# Patient Record
Sex: Male | Born: 1955 | Race: White | Hispanic: No | Marital: Married | Smoking: Former smoker
Health system: Southern US, Community
[De-identification: ages and names within clinical notes are randomized; demographics above are authoritative.]

## PROBLEM LIST (undated history)

## (undated) DIAGNOSIS — I10 Essential (primary) hypertension: Secondary | ICD-10-CM

## (undated) DIAGNOSIS — E119 Type 2 diabetes mellitus without complications: Secondary | ICD-10-CM

## (undated) DIAGNOSIS — E7800 Pure hypercholesterolemia, unspecified: Secondary | ICD-10-CM

## (undated) HISTORY — PX: CHOLECYSTECTOMY: SHX55

---

## 2018-07-02 ENCOUNTER — Encounter (HOSPITAL_BASED_OUTPATIENT_CLINIC_OR_DEPARTMENT_OTHER): Payer: Self-pay | Admitting: *Deleted

## 2018-07-02 ENCOUNTER — Other Ambulatory Visit: Payer: Self-pay

## 2018-07-02 ENCOUNTER — Emergency Department (HOSPITAL_BASED_OUTPATIENT_CLINIC_OR_DEPARTMENT_OTHER): Payer: BLUE CROSS/BLUE SHIELD

## 2018-07-02 ENCOUNTER — Observation Stay (HOSPITAL_BASED_OUTPATIENT_CLINIC_OR_DEPARTMENT_OTHER)
Admission: EM | Admit: 2018-07-02 | Discharge: 2018-07-04 | Disposition: A | Discharge status: Home / Self Care | Payer: BLUE CROSS/BLUE SHIELD | Attending: Internal Medicine | Admitting: Internal Medicine

## 2018-07-02 DIAGNOSIS — Z87891 Personal history of nicotine dependence: Secondary | ICD-10-CM | POA: Insufficient documentation

## 2018-07-02 DIAGNOSIS — E7800 Pure hypercholesterolemia, unspecified: Secondary | ICD-10-CM | POA: Diagnosis not present

## 2018-07-02 DIAGNOSIS — I10 Essential (primary) hypertension: Secondary | ICD-10-CM | POA: Diagnosis present

## 2018-07-02 DIAGNOSIS — I4510 Unspecified right bundle-branch block: Secondary | ICD-10-CM | POA: Insufficient documentation

## 2018-07-02 DIAGNOSIS — E119 Type 2 diabetes mellitus without complications: Secondary | ICD-10-CM

## 2018-07-02 DIAGNOSIS — Z6835 Body mass index (BMI) 35.0-35.9, adult: Secondary | ICD-10-CM | POA: Insufficient documentation

## 2018-07-02 DIAGNOSIS — G4730 Sleep apnea, unspecified: Secondary | ICD-10-CM

## 2018-07-02 DIAGNOSIS — E669 Obesity, unspecified: Secondary | ICD-10-CM | POA: Insufficient documentation

## 2018-07-02 DIAGNOSIS — G4733 Obstructive sleep apnea (adult) (pediatric): Secondary | ICD-10-CM | POA: Diagnosis not present

## 2018-07-02 DIAGNOSIS — Z8249 Family history of ischemic heart disease and other diseases of the circulatory system: Secondary | ICD-10-CM | POA: Insufficient documentation

## 2018-07-02 DIAGNOSIS — Z833 Family history of diabetes mellitus: Secondary | ICD-10-CM | POA: Insufficient documentation

## 2018-07-02 DIAGNOSIS — R0789 Other chest pain: Principal | ICD-10-CM | POA: Insufficient documentation

## 2018-07-02 DIAGNOSIS — R079 Chest pain, unspecified: Secondary | ICD-10-CM | POA: Diagnosis not present

## 2018-07-02 DIAGNOSIS — R51 Headache: Secondary | ICD-10-CM | POA: Diagnosis not present

## 2018-07-02 DIAGNOSIS — R072 Precordial pain: Secondary | ICD-10-CM

## 2018-07-02 DIAGNOSIS — E785 Hyperlipidemia, unspecified: Secondary | ICD-10-CM | POA: Diagnosis not present

## 2018-07-02 HISTORY — DX: Essential (primary) hypertension: I10

## 2018-07-02 HISTORY — DX: Pure hypercholesterolemia, unspecified: E78.00

## 2018-07-02 HISTORY — DX: Type 2 diabetes mellitus without complications: E11.9

## 2018-07-02 LAB — BASIC METABOLIC PANEL
Anion gap: 7 (ref 5–15)
BUN: 18 mg/dL (ref 8–23)
CO2: 26 mmol/L (ref 22–32)
Calcium: 9.6 mg/dL (ref 8.9–10.3)
Chloride: 106 mmol/L (ref 98–111)
Creatinine, Ser: 1 mg/dL (ref 0.61–1.24)
GFR calc Af Amer: >60 mL/min (ref >60)
GFR calc non Af Amer: >60 mL/min (ref >60)
Glucose, Bld: 86 mg/dL (ref 70–99)
Potassium: 4.3 mmol/L (ref 3.5–5.1)
Sodium: 139 mmol/L (ref 135–145)

## 2018-07-02 LAB — RAPID URINE DRUG SCREEN, HOSP PERFORMED
Amphetamines: NOT DETECTED
Barbiturates: NOT DETECTED
Benzodiazepines: NOT DETECTED
Cocaine: NOT DETECTED
Opiates: NOT DETECTED
Tetrahydrocannabinol: NOT DETECTED

## 2018-07-02 LAB — CBC
HCT: 44.8 % (ref 39.0–52.0)
Hemoglobin: 14.2 g/dL (ref 13.0–17.0)
MCH: 27.4 pg (ref 26.0–34.0)
MCHC: 31.7 g/dL (ref 30.0–36.0)
MCV: 86.5 fL (ref 80.0–100.0)
Platelets: 222 10*3/uL (ref 150–400)
RBC: 5.18 MIL/uL (ref 4.22–5.81)
RDW: 13 % (ref 11.5–15.5)
WBC: 5.7 10*3/uL (ref 4.0–10.5)
nRBC: 0 % (ref 0.0–0.2)

## 2018-07-02 LAB — TROPONIN I
Troponin I: <0.03 ng/mL (ref <0.03)
Troponin I: <0.03 ng/mL (ref <0.03)
Troponin I: <0.03 ng/mL (ref <0.03)

## 2018-07-02 LAB — GLUCOSE, CAPILLARY: Glucose-Capillary: 106 mg/dL — ABNORMAL HIGH (ref 70–99)

## 2018-07-02 MED ORDER — ACETAMINOPHEN 325 MG PO TABS
650.0000 mg | ORAL_TABLET | ORAL | Status: DC | PRN
  Administered 2018-07-02 – 2018-07-03 (×2): 650 mg via ORAL
  Filled 2018-07-02 (×2): qty 2

## 2018-07-02 MED ORDER — ASPIRIN 81 MG PO CHEW
324.0000 mg | CHEWABLE_TABLET | Freq: Once | ORAL | Status: AC
  Administered 2018-07-02: 324 mg via ORAL
  Filled 2018-07-02: qty 4

## 2018-07-02 MED ORDER — ALPRAZOLAM 0.25 MG PO TABS: 0.2500 mg | ORAL_TABLET | Freq: Two times a day (BID) | ORAL | Status: DC | PRN

## 2018-07-02 MED ORDER — MORPHINE SULFATE (PF) 2 MG/ML IV SOLN: 2.0000 mg | INTRAVENOUS | Status: DC | PRN

## 2018-07-02 MED ORDER — ONDANSETRON HCL 4 MG/2ML IJ SOLN: 4.0000 mg | Freq: Four times a day (QID) | INTRAMUSCULAR | Status: DC | PRN

## 2018-07-02 MED ORDER — INSULIN ASPART 100 UNIT/ML ~~LOC~~ SOLN: 0.0000 [IU] | Freq: Every day | SUBCUTANEOUS | Status: DC

## 2018-07-02 MED ORDER — ZOLPIDEM TARTRATE 5 MG PO TABS: 5.0000 mg | ORAL_TABLET | Freq: Every evening | ORAL | Status: DC | PRN

## 2018-07-02 MED ORDER — ENOXAPARIN SODIUM 40 MG/0.4ML ~~LOC~~ SOLN
40.0000 mg | SUBCUTANEOUS | Status: DC
  Administered 2018-07-02: 40 mg via SUBCUTANEOUS
  Filled 2018-07-02: qty 0.4

## 2018-07-02 MED ORDER — INSULIN ASPART 100 UNIT/ML ~~LOC~~ SOLN: 0.0000 [IU] | Freq: Three times a day (TID) | SUBCUTANEOUS | Status: DC

## 2018-07-02 MED ORDER — LISINOPRIL 10 MG PO TABS
20.0000 mg | ORAL_TABLET | Freq: Every day | ORAL | Status: DC
  Administered 2018-07-02 – 2018-07-03 (×2): 20 mg via ORAL
  Filled 2018-07-02 (×2): qty 2

## 2018-07-02 MED ORDER — ASPIRIN 81 MG PO CHEW
324.0000 mg | CHEWABLE_TABLET | Freq: Every day | ORAL | Status: DC
  Administered 2018-07-03: 324 mg via ORAL
  Filled 2018-07-02: qty 4

## 2018-07-02 MED ORDER — HYDRALAZINE HCL 20 MG/ML IJ SOLN: 5.0000 mg | INTRAMUSCULAR | Status: DC | PRN

## 2018-07-02 MED ORDER — SODIUM CHLORIDE 0.9 % IV SOLN
INTRAVENOUS | Status: DC
  Administered 2018-07-02 – 2018-07-03 (×2): via INTRAVENOUS

## 2018-07-02 MED ORDER — NITROGLYCERIN 0.4 MG SL SUBL
0.4000 mg | SUBLINGUAL_TABLET | SUBLINGUAL | Status: DC | PRN
  Administered 2018-07-02: 0.4 mg via SUBLINGUAL
  Filled 2018-07-02: qty 1

## 2018-07-02 MED ORDER — ATORVASTATIN CALCIUM 40 MG PO TABS
40.0000 mg | ORAL_TABLET | Freq: Every day | ORAL | Status: DC
  Administered 2018-07-03 – 2018-07-04 (×2): 40 mg via ORAL
  Filled 2018-07-02 (×2): qty 1

## 2018-07-02 MED ORDER — ACETAMINOPHEN 325 MG PO TABS
650.0000 mg | ORAL_TABLET | Freq: Once | ORAL | Status: AC
  Administered 2018-07-02: 650 mg via ORAL
  Filled 2018-07-02: qty 2

## 2018-07-02 NOTE — ED Triage Notes (Signed)
Pt is here for pressure in his chest.  Pt also has pain radiating to neck and right shoulder.  Pt also notes that his BP has been elevated since Friday (150's instead of 140's) pt has been having HA also and reports that he felt some dizziness and nausea also as well as diaphoresis.  No sob.  Pt is alert and oriented.

## 2018-07-02 NOTE — ED Notes (Signed)
[facility] taking pt to [facility], 4E notified

## 2018-07-02 NOTE — H&P (Addendum)
History and Physical    Bruce CleverHarold Jarchow WUJ:[id number] DOB: [birth year] DOA: 07/02/2018  Referring MD/NP/PA:   [facility]: [facility]   Patient coming from:  The patient is coming from home.  At baseline, pt is independent for most of ADL.        Chief Complaint: chest pain and HA  HPI: Bruce Turner is a 62 y.o. male with medical history significant of hypertension, hyperlipidemia, diabetes mellitus, who presents with chest pain headache.  Patient states that his chest pain started in the early morning that woke him up from sleeping.  It is located in the left lower chest, pressure-like, nonradiating, 5 out of 10 in severity, lasted for about 3 hours, then resolved.  Currently chest pain-free.  Patient has mild dizziness, nausea, but no shortness of breath, cough, fever or chills.  He states that his blood pressure is elevated today, up to 150s, causing headache.  He states that his headache is involving the whole head, particular occipital area.  No vision change, hearing loss, unilateral numbness or tingling his extremities.  No facial droop or slurred speech. No head injury or fall.  He states that he had nausea, vomiting and diarrhea on Friday, which has resolved.  No symptoms of UTI.  Patient states that he has frequent traveling by car and airplane, but no tenderness in the calf areas.  Patient states that he had normal cardiac cath 20 years ago.  He had a positive nuclear stress test 6 months ago [facility] Otis [facility].  ED Course: pt was found to have negative troponin, WBC 5.7, electrolytes renal function okay, temperature normal, bradycardia, oxygen saturation 95% on room air.  Negative chest x-ray.  Patient is placed on telemetry bed for observation.  Review of Systems:   General: no fevers, chills, no body weight gain, has fatigue and HA HEENT: no blurry vision, hearing changes or sore throat Respiratory: no dyspnea, coughing, wheezing CV: has chest pain, no  palpitations GI: no nausea, vomiting, abdominal pain, diarrhea, constipation GU: no dysuria, burning on urination, increased urinary frequency, hematuria  Ext: no leg edema Neuro: no unilateral weakness, numbness, or tingling, no vision change or hearing loss Skin: no rash, no skin tear. MSK: No muscle spasm, no deformity, no limitation of range of movement in spin Heme: No easy bruising.  Travel history: has frequent long distant travel.  Allergy: No Known Allergies  Past Medical History:  Diagnosis Date  . Diabetes mellitus without complication (HCC)   . High cholesterol   . Hypertension     Past Surgical History:  Procedure Laterality Date  . CHOLECYSTECTOMY      Social History:  reports that he has quit smoking. He has never used smokeless tobacco. He reports that he does not drink alcohol or use drugs.  Family History:  Family History  Problem Relation Age of Onset  . Diabetes Mellitus II Mother   . Hypertension Mother   . Heart attack Mother      Prior to Admission medications   Not on File    Physical Exam: Vitals:   07/02/18 1700 07/02/18 1800 07/02/18 1906 07/02/18 1956  BP: 122/76 133/65 (!) 133/103 (!) 155/96  Pulse: (!) 58 63 61 (!) 55  Resp: (!) 28 18 17 18   Temp:    98.2 F (36.8 C)  TempSrc:    Oral  SpO2: 94% 96% 95% 95%  Weight:    119.2 kg  Height:    6' (1.829 m)  General: Not in acute distress HEENT:       Eyes: PERRL, EOMI, no scleral icterus.       ENT: No discharge from the ears and nose, no pharynx injection, no tonsillar enlargement.        Neck: No JVD, no bruit, no mass felt. Heme: No neck lymph node enlargement. Cardiac: S1/S2, RRR, No murmurs, No gallops or rubs. Respiratory: No rales, wheezing, rhonchi or rubs. GI: Soft, nondistended, nontender, no rebound pain, no organomegaly, BS present. GU: No hematuria Ext: No pitting leg edema bilaterally. 2+DP/PT pulse bilaterally. Musculoskeletal: No joint deformities, No joint  redness or warmth, no limitation of ROM in spin. Skin: No rashes.  Neuro: Alert, oriented X3, cranial nerves II-XII grossly intact, moves all extremities normally. Psych: Patient is not psychotic, no suicidal or hemocidal ideation.  Labs on Admission: I have personally reviewed following labs and imaging studies  CBC: Recent Labs  Lab 07/02/18 0947  WBC 5.7  HGB 14.2  HCT 44.8  MCV 86.5  PLT 222   Basic Metabolic Panel: Recent Labs  Lab 07/02/18 0947  NA 139  K 4.3  CL 106  CO2 26  GLUCOSE 86  BUN 18  CREATININE 1.00  CALCIUM 9.6   GFR: Estimated Creatinine Clearance: 103.4 mL/min (by C-G formula based on SCr of 1 mg/dL). Liver Function Tests: No results for input(s): AST, ALT, ALKPHOS, BILITOT, PROT, ALBUMIN in the last 168 hours. No results for input(s): LIPASE, AMYLASE in the last 168 hours. No results for input(s): AMMONIA in the last 168 hours. Coagulation Profile: No results for input(s): INR, PROTIME in the last 168 hours. Cardiac Enzymes: Recent Labs  Lab 07/02/18 0947 07/02/18 1323  TROPONINI <0.03 <0.03   BNP (last 3 results) No results for input(s): PROBNP in the last 8760 hours. HbA1C: No results for input(s): HGBA1C in the last 72 hours. CBG: Recent Labs  Lab 07/02/18 2018  GLUCAP 106*   Lipid Profile: No results for input(s): CHOL, HDL, LDLCALC, TRIG, CHOLHDL, LDLDIRECT in the last 72 hours. Thyroid Function Tests: No results for input(s): TSH, T4TOTAL, FREET4, T3FREE, THYROIDAB in the last 72 hours. Anemia Panel: No results for input(s): VITAMINB12, FOLATE, FERRITIN, TIBC, IRON, RETICCTPCT in the last 72 hours. Urine analysis: No results found for: COLORURINE, APPEARANCEUR, LABSPEC, PHURINE, GLUCOSEU, HGBUR, BILIRUBINUR, KETONESUR, PROTEINUR, UROBILINOGEN, NITRITE, LEUKOCYTESUR Sepsis Labs: @LABRCNTIP (procalcitonin:4,lacticidven:4) )No results found for this or any previous visit (from the past 240 hour(s)).   Radiological Exams on  Admission: Dg Chest 2 View  Result Date: 07/02/2018 CLINICAL DATA:  62 year-old male c/o chest pressure, nausea, dizziness, HA, pain radiating to neck and right shoulder and elevated BP since Friday. Pt states he was told he had an MI in the past. Hx of DM, HTN meds EXAM: CHEST - 2 VIEW COMPARISON:  None. FINDINGS: The heart size and mediastinal contours are within normal limits. Both lungs are clear. The visualized skeletal structures are unremarkable. IMPRESSION: No active cardiopulmonary disease. Electronically Signed   By: Norva PavlovElizabeth  Brown M.D.   On: 07/02/2018 10:51     EKG: Independently reviewed.  Sinus rhythm, QTC 454, LAE, bifascicular block, T wave inversion in lead III/aVF and V4-V6.  Assessment/Plan Principal Problem:   Chest pain Active Problems:   Diabetes mellitus without complication (HCC)   High cholesterol   Hypertension   Chest pain:  He had a positive nuclear stress test 6 months ago [facility] Otis [facility].  EKG showed T wave inversion in lead  III/aVF and V4-V6.  Initial troponin negative.  Will need to rule out ACS.  Patient has frequent long distant traveling, at risk of developing PE, but patient does not have any signs of DVT, no pleuritic chest pain or shortness of breath, no tachycardia, low suspicion for PE.  - will place on Tele bed for obs - cycle CE q6 x3 and repeat EKG in the am  - prn Nitroglycerin, Morphine, and aspirin, lipitor  - Risk factor stratification: will check FLP and A1C , UDS - did not order 2d echo--> please reevaluate pt in AM to decide if pt needs 2D echo. - inpt card consult was requested via Epic  Diabetes mellitus without complication (HCC): Last A1c not on record. Patient is taking Janumet at home.  Blood sugar 86. -SSI -Check A1c  High cholesterol: pt is not sure what he is taking at home. -will start Lipitor 40 mg daily -Follow-up FLP  HTN: Blood pressure 155/96 -Continue home medications: Lisinopril, but increased dose  from 10 to 20 mg daily -IV hydralazine prn  HA: Likely due to elevated blood pressure.  No focal neurologic findings on physical examination.  No recent fall or head injury. -PRN Tylenol    DVT ppx:  SQ Lovenox Code Status: Full code Family Communication:  Yes, patient's wife    at bed side Disposition Plan:  Anticipate discharge back to previous home environment Consults called: None Admission status: Obs / tele    Date of Service 07/02/2018    Lorretta Harp [facility] Pager [phone]  If 7PM-7AM, please contact night-coverage [email or web address] Password [facility] 07/02/2018, 10:05 PM    Patient seen and examined. Agree with assessment and plan.  Mr. Myrle Dues is a 61 year old gentleman who has a longstanding history of hypertension, hyperlipidemia, family history for CAD, OSA untreated for at least 10 - 15 years, and  remote tobacco use, having quit 20 years ago.  Patient is followed by Burlene Arnt, PA-C at [facility].  28 months ago he is currently had undergone a stress test and was told that this was mildly abnormal with possible small scar in the portion of his heart.  He recently noticed his blood pressure has been elevated and had increased up to 165/103 at home.  Recently had developed chest pressure and tightness is just prior to waking leading to presentation at [facility].  His chest pain was promptly relieved with sublingual nitroglycerin.  He was transferred to [facility] for further evaluation and treatment.  Initial troponin is negative.  ECG has shown sinus bradycardia 57 bpm with right bundle branch block, LVH, and nondiagnostic T wave changes laterally.  On exam, blood pressure is elevated at 155/96.  He is bradycardic with pulse in the upper 50s.  He is obese.  HEENT is unremarkable with the exception of a Mallinpatti 3 and thick neck.  Lungs were clear.  He did not have chest wall tenderness.  Rhythm was regular with no S3 or S4 gallop.  Abdomen is  moderately protuberant.  Pulses are 2+.  He did not have clubbing cyanosis or edema.  Neurologic exam was nonfocal.  Affect was normal.  After much discussion with the patient and his wife, with his cardiac risk factor profile notable for hypertension, hyperlipidemia, diabetes mellitus, as well as family history for CAD and with his untreated sleep apnea possibly accounting for nocturnal hypoxemia contributing to his response of chest pain, I have recommended definitive cardiac catheterization.  The patient has been n.p.o.  all day and we will try to get this scheduled for this afternoon if the schedule allows. I have reviewed the risks, indications, and alternatives to cardiac catheterization, possible angioplasty, and stenting with the patient. Risks include but are not limited to bleeding, infection, vascular injury, stroke, myocardial infection, arrhythmia, kidney injury, radiation-related injury in the case of prolonged fluoroscopy use, emergency cardiac surgery, and death. The patient understands the risks of serious complication is 1-2 in 1000 with diagnostic cardiac cath and 1-2% or less with angioplasty/stenting.  Patient has agreed to undergo the procedure and is currently now on the schedule for this can be done later today.   Lennette Bihari, MD, [facility] 07/03/2018 2:55 PM

## 2018-07-02 NOTE — ED Notes (Signed)
ED TO INPATIENT HANDOFF REPORT  Name/Age/Gender Bruce Turner 62 y.o. male  Code Status Advance Directive Documentation     Most Recent Value  Type of Advance Directive  Living will  Pre-existing out of facility DNR order (yellow form or pink MOST form)  -  "MOST" Form in Place?  -      Home/SNF/Other Home  Chief Complaint CHEST PAINS,HIGH BP  Level of Care/Admitting Diagnosis ED Disposition    ED Disposition Condition Comment   Admit  [facility] [[id number]]  Level of Care: Cardiac Telemetry [103]  Diagnosis: Precordial chest pain [186069]  Admitting Physician: Sharlene DoryWENDLING, NICHOLAS PAUL [[id number]]  Attending Physician: Sharlene DoryWENDLING, NICHOLAS PAUL [phone][[id number]]  PT Class (Do Not Modify): Observation [104]  PT Acc Code (Do Not Modify): Observation [10022]       Medical History Past Medical History:  Diagnosis Date  . Diabetes mellitus without complication (HCC)   . High cholesterol   . Hypertension     Allergies No Known Allergies  IV Location/Drains/Wounds Patient Lines/Drains/Airways Status   Active Line/Drains/Airways    Name:   Placement date:   Placement time:   Site:   Days:   Peripheral IV 07/02/18 Right Antecubital   07/02/18    0948    Antecubital   less than 1          Labs/Imaging Results for orders placed or performed during the hospital encounter of 07/02/18 (from the past 48 hour(s))  Basic metabolic panel     Status: None   Collection Time: 07/02/18  9:47 AM  Result Value Ref Range   Sodium 139 135 - 145 mmol/L   Potassium 4.3 3.5 - 5.1 mmol/L   Chloride 106 98 - 111 mmol/L   CO2 26 22 - 32 mmol/L   Glucose, Bld 86 70 - 99 mg/dL   BUN 18 8 - 23 mg/dL   Creatinine, Ser 6.211.00 0.61 - 1.24 mg/dL   Calcium 9.6 8.9 - 30.810.3 mg/dL   GFR calc non Af Amer >60 >60 mL/min   GFR calc Af Amer >60 >60 mL/min   Anion gap 7 5 - 15    Comment: Performed at [facility] [place], [address] [place], [place] [address]  CBC      Status: None   Collection Time: 07/02/18  9:47 AM  Result Value Ref Range   WBC 5.7 4.0 - 10.5 K/uL   RBC 5.18 4.22 - 5.81 MIL/uL   Hemoglobin 14.2 13.0 - 17.0 g/dL   HCT 69.644.8 29.539.0 - 28.452.0 %   MCV 86.5 80.0 - 100.0 fL   MCH 27.4 26.0 - 34.0 pg   MCHC 31.7 30.0 - 36.0 g/dL   RDW 13.213.0 44.011.5 - 10.215.5 %   Platelets 222 150 - 400 K/uL   nRBC 0.0 0.0 - 0.2 %    Comment: Performed at [facility] [place], [address] [place], [place] [address]  Troponin I - ONCE - STAT     Status: None   Collection Time: 07/02/18  9:47 AM  Result Value Ref Range   Troponin I <0.03 <0.03 ng/mL    Comment: Performed at [facility] [place], [address] [place], [place] [address]   Dg Chest 2 View  Result Date: 07/02/2018 CLINICAL DATA:  62 year-old male c/o chest pressure, nausea, dizziness, HA, pain radiating to neck and right shoulder and elevated BP since Friday. Pt states he was told he had an  MI in the past. Hx of DM, HTN meds EXAM: CHEST - 2 VIEW COMPARISON:  None. FINDINGS: The heart size and mediastinal contours are within normal limits. Both lungs are clear. The visualized skeletal structures are unremarkable. IMPRESSION: No active cardiopulmonary disease. Electronically Signed   By: Norva PavlovElizabeth  Brown M.D.   On: 07/02/2018 10:51   EKG Interpretation  Date/Time:  Sunday July 02 2018 09:35:09 EST Ventricular Rate:  57 PR Interval:    QRS Duration: 169 QT Interval:  466 QTC Calculation: 454 R Axis:   -61 Text Interpretation:  Sinus rhythm Right bundle branch block LVH with IVCD and secondary repol abnrm No old tracing for comparison. No STEMI.  Confirmed by Long, Joshua ([id number]) on 07/02/2018 9:39:28 AM   Pending Labs Unresulted Labs (From admission, onward)    Start     Ordered   07/02/18 1241  Troponin I - Once  Once,   STAT     12 /29/19 1240          Vitals/Pain Today's Vitals   07/02/18 0937 07/02/18 1000 07/02/18 1023 07/02/18 1100  BP:  (!) 148/93 (!) 135/93 117/63   Pulse:  (!) 51 (!) 56 (!) 51  Resp:  17 11 11   Temp:      TempSrc:      SpO2:  91% 95% 97%  Weight:      Height:      PainSc: 10-Worst pain ever       Isolation Precautions No active isolations  Medications Medications  nitroGLYCERIN (NITROSTAT) SL tablet 0.4 mg (0.4 mg Sublingual Given 07/02/18 1030)  aspirin chewable tablet 324 mg (324 mg Oral Given 07/02/18 1029)    Mobility walks

## 2018-07-02 NOTE — ED Notes (Addendum)
Call to 4E to give report, RN to call me back

## 2018-07-02 NOTE — Plan of Care (Signed)
Poc initiated and progressing.  

## 2018-07-02 NOTE — ED Notes (Signed)
Report given to Sue LushAndrea RN on 4E.  Await [facility] arrival here to transport pt

## 2018-07-02 NOTE — ED Provider Notes (Signed)
Emergency Department Provider Note   I have reviewed the triage vital signs and the nursing notes.   HISTORY  Chief Complaint Chest Pain   HPI Bruce Turner is a 62 y.o. male with PMH of DM, HTN, and HLD presents to the emergency department for evaluation of chest discomfort radiating to the neck with an associated headache.  Patient states he is been told he had a heart attack before but this was found on a nuclear stress test and he had no symptoms at the time.  He has been compliant with his medications.  Symptoms have been constant for the past 3 days.  No provoking or modifying factors.  No fevers, chills, productive cough.  Patient describes his headache as diffuse, mild, and without sudden onset.  He describes his chest pain as a constant soreness/pressure.  He has had some diaphoresis but none currently.   Past Medical History:  Diagnosis Date  . Diabetes mellitus without complication (HCC)   . High cholesterol   . Hypertension     Patient Active Problem List   Diagnosis Date Noted  . Chest pain 07/02/2018  . Precordial chest pain 07/02/2018    Past Surgical History:  Procedure Laterality Date  . CHOLECYSTECTOMY      Allergies Patient has no known allergies.  No family history on file.  Social History Social History   Tobacco Use  . Smoking status: Former Games developermoker  . Smokeless tobacco: Never Used  Substance Use Topics  . Alcohol use: Never    Frequency: Never  . Drug use: Never    Review of Systems  Constitutional: No fever/chills Eyes: No visual changes. ENT: No sore throat. Cardiovascular: Positive chest pain. Respiratory: Denies shortness of breath. Gastrointestinal: No abdominal pain.  No nausea, no vomiting.  No diarrhea.  No constipation. Genitourinary: Negative for dysuria. Musculoskeletal: Negative for back pain. Skin: Negative for rash. Neurological: Negative for headaches, focal weakness or numbness.  10-point ROS otherwise  negative.  ____________________________________________   PHYSICAL EXAM:  VITAL SIGNS: ED Triage Vitals  Enc Vitals Group     BP 07/02/18 0926 (!) 173/90     Pulse Rate 07/02/18 0926 (!) 56     Resp 07/02/18 0926 18     Temp 07/02/18 0926 98.3 F (36.8 C)     Temp Source 07/02/18 0926 Oral     SpO2 07/02/18 0926 96 %     Weight 07/02/18 0926 270 lb (122.5 kg)     Height 07/02/18 0926 6' (1.829 m)     Pain Score 07/02/18 0937 10   Constitutional: Alert and oriented. Well appearing and in no acute distress. Eyes: Conjunctivae are normal.  Head: Atraumatic. Nose: No congestion/rhinnorhea. Mouth/Throat: Mucous membranes are moist. Neck: No stridor.   Cardiovascular: Normal rate, regular rhythm. Good peripheral circulation. Grossly normal heart sounds.   Respiratory: Normal respiratory effort.  No retractions. Lungs CTAB. Gastrointestinal: Soft and nontender. No distention.  Musculoskeletal: No lower extremity tenderness nor edema. No gross deformities of extremities. Neurologic:  Normal speech and language. No gross focal neurologic deficits are appreciated.  Skin:  Skin is warm, dry and intact. No rash noted.  ____________________________________________   LABS (all labs ordered are listed, but only abnormal results are displayed)  Labs Reviewed  BASIC METABOLIC PANEL  CBC  TROPONIN I  TROPONIN I   ____________________________________________  EKG   EKG Interpretation  Date/Time:  Sunday July 02 2018 09:35:09 EST Ventricular Rate:  57 PR Interval:    QRS  Duration: 169 QT Interval:  466 QTC Calculation: 454 R Axis:   -61 Text Interpretation:  Sinus rhythm Right bundle branch block LVH with IVCD and secondary repol abnrm No old tracing for comparison. No STEMI.  Confirmed by Alona BeneLong, Kj Imbert [phone]([id number]) on 07/02/2018 9:39:28 AM       ____________________________________________  RADIOLOGY  Dg Chest 2 View  Result Date: 07/02/2018 CLINICAL DATA:  62  year-old male c/o chest pressure, nausea, dizziness, HA, pain radiating to neck and right shoulder and elevated BP since Friday. Pt states he was told he had an MI in the past. Hx of DM, HTN meds EXAM: CHEST - 2 VIEW COMPARISON:  None. FINDINGS: The heart size and mediastinal contours are within normal limits. Both lungs are clear. The visualized skeletal structures are unremarkable. IMPRESSION: No active cardiopulmonary disease. Electronically Signed   By: Norva PavlovElizabeth  Brown M.D.   On: 07/02/2018 10:51    ____________________________________________   PROCEDURES  Procedure(s) performed:   Procedures  None ____________________________________________   INITIAL IMPRESSION / ASSESSMENT AND PLAN / ED COURSE  Pertinent labs & imaging results that were available during my care of the patient were reviewed by me and considered in my medical decision making (see chart for details).  Patient presents to the emergency department for evaluation of chest pain with headache.  Patient has multiple cardiovascular risk factors. HEART score of 6.  Does not follow with cardiology currently.  Reports a remote history of left heart cath with no intervention but this was approximately 18 years ago.  Plan for aspirin and nitroglycerin.  EKG shows significant repolarization abnormality but no STEMI.   CXR and labs reviewed. Patient is chest pain free after Nitro. Plan for admit.   Discussed patient's case with Hospitalist, Dr. Carmelia RollerWendling to request admission. Patient and family (if present) updated with plan. Care transferred to Hospitalist service.  I reviewed all nursing notes, vitals, pertinent old records, EKGs, labs, imaging (as available).  ____________________________________________  FINAL CLINICAL IMPRESSION(S) / ED DIAGNOSES  Final diagnoses:  Precordial chest pain     MEDICATIONS GIVEN DURING THIS VISIT:  Medications  nitroGLYCERIN (NITROSTAT) SL tablet 0.4 mg (0.4 mg Sublingual Given  07/02/18 1030)  aspirin chewable tablet 324 mg (324 mg Oral Given 07/02/18 1029)  acetaminophen (TYLENOL) tablet 650 mg (650 mg Oral Given 07/02/18 1340)    Note:  This document was prepared using Dragon voice recognition software and may include unintentional dictation errors.  Alona BeneJoshua Fatiha Guzy, MD Emergency Medicine    Nicoli Nardozzi, Arlyss RepressJoshua G, MD 07/02/18 [phone]

## 2018-07-02 NOTE — ED Notes (Signed)
Report given to [facility]. 

## 2018-07-03 ENCOUNTER — Encounter (HOSPITAL_COMMUNITY)
Admission: EM | Disposition: A | Discharge status: Home / Self Care | Payer: Self-pay | Source: Home / Self Care | Attending: Emergency Medicine

## 2018-07-03 DIAGNOSIS — R072 Precordial pain: Secondary | ICD-10-CM

## 2018-07-03 DIAGNOSIS — E119 Type 2 diabetes mellitus without complications: Secondary | ICD-10-CM | POA: Diagnosis not present

## 2018-07-03 DIAGNOSIS — R079 Chest pain, unspecified: Secondary | ICD-10-CM | POA: Diagnosis not present

## 2018-07-03 DIAGNOSIS — G4730 Sleep apnea, unspecified: Secondary | ICD-10-CM

## 2018-07-03 DIAGNOSIS — R0789 Other chest pain: Secondary | ICD-10-CM | POA: Diagnosis not present

## 2018-07-03 DIAGNOSIS — E7800 Pure hypercholesterolemia, unspecified: Secondary | ICD-10-CM | POA: Diagnosis not present

## 2018-07-03 DIAGNOSIS — E669 Obesity, unspecified: Secondary | ICD-10-CM | POA: Diagnosis not present

## 2018-07-03 DIAGNOSIS — I10 Essential (primary) hypertension: Secondary | ICD-10-CM | POA: Diagnosis not present

## 2018-07-03 HISTORY — PX: ULTRASOUND GUIDANCE FOR VASCULAR ACCESS: SHX6516

## 2018-07-03 HISTORY — PX: LEFT HEART CATH AND CORONARY ANGIOGRAPHY: CATH118249

## 2018-07-03 LAB — CREATININE, SERUM
Creatinine, Ser: 0.94 mg/dL (ref 0.61–1.24)
GFR calc Af Amer: >60 mL/min (ref >60)
GFR calc non Af Amer: >60 mL/min (ref >60)

## 2018-07-03 LAB — GLUCOSE, CAPILLARY
GLUCOSE-CAPILLARY: 83 mg/dL (ref 70–99)
Glucose-Capillary: 101 mg/dL — ABNORMAL HIGH (ref 70–99)
Glucose-Capillary: 82 mg/dL (ref 70–99)
Glucose-Capillary: 170 mg/dL — ABNORMAL HIGH (ref 70–99)

## 2018-07-03 LAB — CBC
HCT: 42.6 % (ref 39.0–52.0)
HEMOGLOBIN: 14.2 g/dL (ref 13.0–17.0)
MCH: 28 pg (ref 26.0–34.0)
MCHC: 33.3 g/dL (ref 30.0–36.0)
MCV: 84 fL (ref 80.0–100.0)
Platelets: 216 10*3/uL (ref 150–400)
RBC: 5.07 MIL/uL (ref 4.22–5.81)
RDW: 12.8 % (ref 11.5–15.5)
WBC: 5.7 10*3/uL (ref 4.0–10.5)
nRBC: 0 % (ref 0.0–0.2)

## 2018-07-03 LAB — TROPONIN I: Troponin I: <0.03 ng/mL (ref <0.03)

## 2018-07-03 LAB — LIPID PANEL
Cholesterol: 135 mg/dL (ref 0–200)
HDL: 43 mg/dL (ref >40)
LDL Cholesterol: 67 mg/dL (ref 0–99)
Total CHOL/HDL Ratio: 3.1 RATIO
Triglycerides: 124 mg/dL (ref <150)
VLDL: 25 mg/dL (ref 0–40)

## 2018-07-03 LAB — HEMOGLOBIN A1C
Hgb A1c MFr Bld: 5.4 % (ref 4.8–5.6)
Mean Plasma Glucose: 108.28 mg/dL

## 2018-07-03 LAB — HIV ANTIBODY (ROUTINE TESTING W REFLEX): HIV Screen 4th Generation wRfx: NONREACTIVE

## 2018-07-03 SURGERY — LEFT HEART CATH AND CORONARY ANGIOGRAPHY
Anesthesia: LOCAL

## 2018-07-03 MED ORDER — IOHEXOL 350 MG/ML SOLN
INTRAVENOUS | Status: DC | PRN
  Administered 2018-07-03: 55 mL via INTRA_ARTERIAL

## 2018-07-03 MED ORDER — VERAPAMIL HCL 2.5 MG/ML IV SOLN
INTRAVENOUS | Status: AC
  Filled 2018-07-03: qty 2

## 2018-07-03 MED ORDER — SODIUM CHLORIDE 0.9% FLUSH
3.0000 mL | Freq: Two times a day (BID) | INTRAVENOUS | Status: DC
  Administered 2018-07-03 – 2018-07-04 (×2): 3 mL via INTRAVENOUS

## 2018-07-03 MED ORDER — MIDAZOLAM HCL 2 MG/2ML IJ SOLN
INTRAMUSCULAR | Status: DC | PRN
  Administered 2018-07-03: 1 mg via INTRAVENOUS

## 2018-07-03 MED ORDER — VERAPAMIL HCL 2.5 MG/ML IV SOLN
INTRAVENOUS | Status: DC | PRN
  Administered 2018-07-03: 10 mL via INTRA_ARTERIAL

## 2018-07-03 MED ORDER — IRBESARTAN 150 MG PO TABS
150.0000 mg | ORAL_TABLET | Freq: Every day | ORAL | Status: DC
  Administered 2018-07-03 – 2018-07-04 (×2): 150 mg via ORAL
  Filled 2018-07-03 (×2): qty 1

## 2018-07-03 MED ORDER — HEPARIN SODIUM (PORCINE) 1000 UNIT/ML IJ SOLN
INTRAMUSCULAR | Status: AC
  Filled 2018-07-03: qty 1

## 2018-07-03 MED ORDER — ACETAMINOPHEN 325 MG PO TABS
650.0000 mg | ORAL_TABLET | ORAL | Status: DC | PRN
  Administered 2018-07-03: 650 mg via ORAL
  Filled 2018-07-03: qty 2

## 2018-07-03 MED ORDER — ASPIRIN 81 MG PO CHEW: 81.0000 mg | CHEWABLE_TABLET | ORAL | Status: DC

## 2018-07-03 MED ORDER — ENOXAPARIN SODIUM 40 MG/0.4ML ~~LOC~~ SOLN
40.0000 mg | SUBCUTANEOUS | Status: DC
  Administered 2018-07-04: 40 mg via SUBCUTANEOUS
  Filled 2018-07-03: qty 0.4

## 2018-07-03 MED ORDER — HEPARIN (PORCINE) IN NACL 1000-0.9 UT/500ML-% IV SOLN
INTRAVENOUS | Status: DC | PRN
  Administered 2018-07-03: 500 mL

## 2018-07-03 MED ORDER — SODIUM CHLORIDE 0.9 % IV SOLN: 250.0000 mL | INTRAVENOUS | Status: DC | PRN

## 2018-07-03 MED ORDER — ASPIRIN 81 MG PO CHEW
81.0000 mg | CHEWABLE_TABLET | Freq: Every day | ORAL | Status: DC
  Administered 2018-07-04: 81 mg via ORAL
  Filled 2018-07-03: qty 1

## 2018-07-03 MED ORDER — HEPARIN SODIUM (PORCINE) 1000 UNIT/ML IJ SOLN
INTRAMUSCULAR | Status: DC | PRN
  Administered 2018-07-03: 5000 [IU] via INTRAVENOUS

## 2018-07-03 MED ORDER — SODIUM CHLORIDE 0.9% FLUSH: 3.0000 mL | INTRAVENOUS | Status: DC | PRN

## 2018-07-03 MED ORDER — HEPARIN (PORCINE) IN NACL 1000-0.9 UT/500ML-% IV SOLN
INTRAVENOUS | Status: AC
  Filled 2018-07-03: qty 500

## 2018-07-03 MED ORDER — FENTANYL CITRATE (PF) 100 MCG/2ML IJ SOLN
INTRAMUSCULAR | Status: AC
  Filled 2018-07-03: qty 2

## 2018-07-03 MED ORDER — ONDANSETRON HCL 4 MG/2ML IJ SOLN: 4.0000 mg | Freq: Four times a day (QID) | INTRAMUSCULAR | Status: DC | PRN

## 2018-07-03 MED ORDER — SODIUM CHLORIDE 0.9 % WEIGHT BASED INFUSION: 3.0000 mL/kg/h | INTRAVENOUS | Status: DC

## 2018-07-03 MED ORDER — SODIUM CHLORIDE 0.9% FLUSH: 3.0000 mL | Freq: Two times a day (BID) | INTRAVENOUS | Status: DC

## 2018-07-03 MED ORDER — HYDRALAZINE HCL 20 MG/ML IJ SOLN
INTRAMUSCULAR | Status: DC | PRN
  Administered 2018-07-03: 10 mg via INTRAVENOUS

## 2018-07-03 MED ORDER — LIDOCAINE HCL (PF) 1 % IJ SOLN
INTRAMUSCULAR | Status: AC
  Filled 2018-07-03: qty 30

## 2018-07-03 MED ORDER — LIDOCAINE HCL (PF) 1 % IJ SOLN
INTRAMUSCULAR | Status: DC | PRN
  Administered 2018-07-03: 2 mL

## 2018-07-03 MED ORDER — MIDAZOLAM HCL 2 MG/2ML IJ SOLN
INTRAMUSCULAR | Status: AC
  Filled 2018-07-03: qty 2

## 2018-07-03 MED ORDER — FENTANYL CITRATE (PF) 100 MCG/2ML IJ SOLN
INTRAMUSCULAR | Status: DC | PRN
  Administered 2018-07-03: 25 ug via INTRAVENOUS

## 2018-07-03 MED ORDER — SODIUM CHLORIDE 0.9 % WEIGHT BASED INFUSION: 1.0000 mL/kg/h | INTRAVENOUS | Status: DC

## 2018-07-03 MED ORDER — SODIUM CHLORIDE 0.9 % IV SOLN
INTRAVENOUS | Status: AC
  Administered 2018-07-03 (×2): via INTRAVENOUS

## 2018-07-03 MED ORDER — HYDRALAZINE HCL 20 MG/ML IJ SOLN
INTRAMUSCULAR | Status: AC
  Filled 2018-07-03: qty 1

## 2018-07-03 SURGICAL SUPPLY — 10 items
CATH 5FR JL3.5 JR4 ANG PIG MP (CATHETERS) ×3 IMPLANT
DEVICE RAD COMP TR BAND LRG (VASCULAR PRODUCTS) ×3 IMPLANT
GLIDESHEATH SLEND SS 6F .021 (SHEATH) ×3 IMPLANT
GUIDEWIRE INQWIRE 1.5J.035X260 (WIRE) ×2 IMPLANT
INQWIRE 1.5J .035X260CM (WIRE) ×3
KIT HEART LEFT (KITS) ×3 IMPLANT
PACK CARDIAC CATHETERIZATION (CUSTOM PROCEDURE TRAY) ×3 IMPLANT
SHEATH PROBE COVER 6X72 (BAG) ×3 IMPLANT
TRANSDUCER W/STOPCOCK (MISCELLANEOUS) ×3 IMPLANT
TUBING CIL FLEX 10 FLL-RA (TUBING) ×3 IMPLANT

## 2018-07-03 NOTE — Progress Notes (Signed)
PROGRESS NOTE    Bruce CleverHarold Berberian  ZOX:[id number] DOB: 1956/04/03 DOA: 07/02/2018 PCP: [facility]   Brief Narrative:   Bruce Turner is a 62 y.o. male with medical history significant of hypertension, hyperlipidemia, diabetes mellitus, who presents with chest pain and headache.  He had a positive nuclear stress test 6 months ago by Dr. Otis Peakaran [facility].  EKG showed T wave inversion in lead III/aVF and V4-V6.  Patient has ruled out for myocardial infarction by serial enzymes.  EKG unhelpful because of bifascicular block.  Assessment & Plan:   Principal Problem:   Chest pain Active Problems:   Diabetes mellitus without complication (HCC)   High cholesterol   Hypertension   Chest pain:  He had a positive nuclear stress test 6 months ago by Dr. Otis [facility].  EKG showed T wave inversion in lead III/aVF and V4-V6.  Initial troponin negative.  Will need to rule out ACS.  Patient has frequent long distant traveling, at risk of developing PE, but patient does not have any signs of DVT, no pleuritic chest pain or shortness of breath, no tachycardia, low suspicion for PE.  - will place on Tele bed for obs -Serial troponins negative for myocardial infarction.  EKG unhelpful due to bifascicular block. - prn Nitroglycerin, Morphine, and aspirin, lipitor  - Risk factor stratification: Fasting lipid panel normal, hemoglobin A1c 5.4 and unremarkable, screen negative. -Cardiology to determine if echocardiogram will be useful given he had recent stress testing. - inpt card consult was requested via Epic and will see patient soon.  Diabetes mellitus without complication (HCC): Last A1c not on record. Patient is taking Janumet at home.  Blood sugar 86. -SSI -Hemoglobin A1c at goal  High cholesterol: pt is not sure what he is taking at home.  FLP at goal -will start Lipitor 40 mg daily while in hospital -Resume home medications at discharge  HTN: Blood pressure 155/96 -Continue  home medications: Lisinopril, but increased dose from 10 to 20 mg daily -IV hydralazine prn -Blood pressure improving.  HA: Likely due to elevated blood pressure.  No focal neurologic findings on physical examination.  No recent fall or head injury. -PRN Tylenol    DVT ppx:  SQ Lovenox Code Status: Full code Family Communication:  Yes, patient's wife    at bed side Disposition Plan:  Anticipate discharge back to previous home environment Consults called: None Admission status: Obs / tele    Subjective: Feeling better.  Cardiology consult is pending.  No new complaints.  Headache persists.  Objective: Vitals:   07/02/18 1956 07/03/18 0537 07/03/18 0647 07/03/18 0900  BP: (!) 155/96 (!) 162/96 (!) 161/87 (!) 156/82  Pulse: (!) 55 62    Resp: 18 16    Temp: 98.2 F (36.8 C) 97.8 F (36.6 C)  98.1 F (36.7 C)  TempSrc: Oral Oral  Oral  SpO2: 95% 92%    Weight: 119.2 kg     Height: 6' (1.829 m)       Intake/Output Summary (Last 24 hours) at 07/03/2018 1241 Last data filed at 07/03/2018 0730 Gross per 24 hour  Intake 503.84 ml  Output 500 ml  Net 3.84 ml   Filed Weights   07/02/18 0926 07/02/18 1956  Weight: 122.5 kg 119.2 kg    Examination:  General exam: Appears calm and comfortable  Respiratory system: Clear to auscultation. Respiratory effort normal. Cardiovascular system: S1 & S2 heard, RRR. No JVD, murmurs, rubs, gallops or clicks. No pedal  edema. Gastrointestinal system: Abdomen is nondistended, soft and nontender. No organomegaly or masses felt. Normal bowel sounds heard. Central nervous system: Alert and oriented. No focal neurological deficits. Extremities: Symmetric 5 x 5 power. Skin: No rashes, lesions or ulcers Psychiatry: Judgement and insight appear normal. Mood & affect appropriate.     Data Reviewed: I have personally reviewed following labs and imaging studies  CBC: Recent Labs  Lab 07/02/18 0947  WBC 5.7  HGB 14.2  HCT 44.8  MCV  86.5  PLT 222   Basic Metabolic Panel: Recent Labs  Lab 07/02/18 0947  NA 139  K 4.3  CL 106  CO2 26  GLUCOSE 86  BUN 18  CREATININE 1.00  CALCIUM 9.6   GFR: Estimated Creatinine Clearance: 103.4 mL/min (by C-G formula based on SCr of 1 mg/dL). Liver Function Tests: No results for input(s): AST, ALT, ALKPHOS, BILITOT, PROT, ALBUMIN in the last 168 hours. No results for input(s): LIPASE, AMYLASE in the last 168 hours. No results for input(s): AMMONIA in the last 168 hours. Coagulation Profile: No results for input(s): INR, PROTIME in the last 168 hours. Cardiac Enzymes: Recent Labs  Lab 07/02/18 0947 07/02/18 1323 07/02/18 2221 07/03/18 0409 07/03/18 1002  TROPONINI <0.03 <0.03 <0.03 <0.03 <0.03   BNP (last 3 results) No results for input(s): PROBNP in the last 8760 hours. HbA1C: Recent Labs    07/03/18 0409  HGBA1C 5.4   CBG: Recent Labs  Lab 07/02/18 2018 07/03/18 0620 07/03/18 1121  GLUCAP 106* 83 101*   Lipid Profile: Recent Labs    07/03/18 0409  CHOL 135  HDL 43  LDLCALC 67  TRIG 124  CHOLHDL 3.1   Thyroid Function Tests: No results for input(s): TSH, T4TOTAL, FREET4, T3FREE, THYROIDAB in the last 72 hours. Anemia Panel: No results for input(s): VITAMINB12, FOLATE, FERRITIN, TIBC, IRON, RETICCTPCT in the last 72 hours. Sepsis Labs: No results for input(s): PROCALCITON, LATICACIDVEN in the last 168 hours.  No results found for this or any previous visit (from the past 240 hour(s)).       Radiology Studies: Dg Chest 2 View  Result Date: 07/02/2018 CLINICAL DATA:  62 year-old male c/o chest pressure, nausea, dizziness, HA, pain radiating to neck and right shoulder and elevated BP since Friday. Pt states he was told he had an MI in the past. Hx of DM, HTN meds EXAM: CHEST - 2 VIEW COMPARISON:  None. FINDINGS: The heart size and mediastinal contours are within normal limits. Both lungs are clear. The visualized skeletal structures are  unremarkable. IMPRESSION: No active cardiopulmonary disease. Electronically Signed   By: Norva PavlovElizabeth  Brown M.D.   On: 07/02/2018 10:51        Scheduled Meds: . aspirin  324 mg Oral Daily  . atorvastatin  40 mg Oral q1800  . enoxaparin (LOVENOX) injection  40 mg Subcutaneous Q24H  . insulin aspart  0-5 Units Subcutaneous QHS  . insulin aspart  0-9 Units Subcutaneous TID WC  . lisinopril  20 mg Oral Daily   Continuous Infusions: . sodium chloride 100 mL/hr at 07/03/18 0929     LOS: 0 days    Time spent: 37 minutes.    Lahoma Crockerheresa C Tristian Sickinger, MD FACP [facility] Pager [phone]  If 7PM-7AM, please contact night-coverage [email or web address] Password [email or web address] 07/03/2018, 12:41 PM

## 2018-07-03 NOTE — Progress Notes (Signed)
Patient back to room from cath lab at this time. V/s and assessment done. Right radial level 0 with TR band in place. Will continue to monitor.  Ernestina ColumbiaK. Starr Bianka Liberati, RN

## 2018-07-03 NOTE — Interval H&P Note (Signed)
Cath Lab Visit (complete for each Cath Lab visit)  Clinical Evaluation Leading to the Procedure:   ACS: Yes.    Non-ACS:    Anginal Classification: CCS III  Anti-ischemic medical therapy: Minimal Therapy (1 class of medications)  Non-Invasive Test Results: No non-invasive testing performed  Prior CABG: No previous CABG      History and Physical Interval Note:  07/03/2018 6:06 PM  Bruce Turner  has presented today for surgery, with the diagnosis of cp  The various methods of treatment have been discussed with the patient and family. After consideration of risks, benefits and other options for treatment, the patient has consented to  Procedure(s): LEFT HEART CATH AND CORONARY ANGIOGRAPHY (N/A) Ultrasound Guidance For Vascular Access as a surgical intervention .  The patient's history has been reviewed, patient examined, no change in status, stable for surgery.  I have reviewed the patient's chart and labs.  Questions were answered to the patient's satisfaction.     Lyn RecordsHenry W Shrihaan Porzio III

## 2018-07-03 NOTE — Progress Notes (Signed)
Patient to cath lab at this time

## 2018-07-03 NOTE — Progress Notes (Signed)
Pt brady most of the night while sleeping. Blood pressure is still on the high end but does not meet the parameters to be treated with PRN meds. Will continue to monitor.

## 2018-07-03 NOTE — CV Procedure (Signed)
   Real-time vascular ultrasound used for vascular access.  Left heart catheterization with coronary angiography performed via right radial.  Widely patent coronary arteries.  Severe blood pressure elevation.  Left ventriculography demonstrates overall low normal LV function with EF approximately 50%.  LVEDP normal at 7 mmHg.  No complications occurred.  10 mg of IV hydralazine was given because of excessive blood pressure elevation.

## 2018-07-03 NOTE — Consult Note (Addendum)
Cardiology Consultation:   Patient ID: Bruce Turner MRN: [id number]; DOB: May 23, 1956  Admit date: 07/02/2018 Date of Consult: 07/03/2018  Primary Care Provider: [facility] Primary Cardiologist: Arnette Felts, [facility]  Patient Profile:   Bruce Turner is a 61 y.o. male with a hx of hypertension, hyperlipidemia, diabetes and remote tobacco smoking who is being seen today for the evaluation of chest pain at the request of Dr. Willette Pa.   Per patient, "history of think blood about 20 to 25 years ago, normal cardiac cath".  Patient has seen by Cardiology PA in [facility] about 65-month ago for chest pain.  Reported to have prior questionable MI based on stress test.  Placed on medical therapy.  History of Present Illness:   Bruce Turner presented for evaluation of headache and chest pain.  His symptoms started Friday as not feeling well and near syncope while eating.  He had vomiting.  His blood pressure was elevated to 150-160s.  Over the weekend he was feeling weak.  Yesterday morning, he woke up with left-sided chest pressure without radiation.  No other associated symptoms.  Blood pressure remained elevated at 150s.  His chest pressure persisted and came to med [facility].  His symptoms resolved after sublingual nitroglycerin x1.  Admitted to [facility] for further evaluation.  He is ruled out.  Troponin negative x5.  No recurrent chest pain.  Hemoglobin A1c 5.4.  Electrolyte and serum creatinine normal.  EKG shows sinus rhythm at rate of 66 bpm, bifascicular block - personally reviewed.  Patient states that he has a chronic intermittent cough likely due to lisinopril.  Unsure if he has tried ARB in past.  He says he takes heart medication and diabetic medication.  Does not remember names.  Will ask pharmacy technician to review his medication.  Hx of 20-pack-year tobacco smoking, quite 20 years ago.  Mother had MI at age 69.  Grandmother had MI in her  48s.  Past Medical History:  Diagnosis Date  . Diabetes mellitus without complication (HCC)   . High cholesterol   . Hypertension     Past Surgical History:  Procedure Laterality Date  . CHOLECYSTECTOMY      Inpatient Medications: Scheduled Meds: . aspirin  324 mg Oral Daily  . atorvastatin  40 mg Oral q1800  . enoxaparin (LOVENOX) injection  40 mg Subcutaneous Q24H  . insulin aspart  0-5 Units Subcutaneous QHS  . insulin aspart  0-9 Units Subcutaneous TID WC  . lisinopril  20 mg Oral Daily   Continuous Infusions: . sodium chloride 100 mL/hr at 07/03/18 0929   PRN Meds: acetaminophen, ALPRAZolam, hydrALAZINE, morphine injection, nitroGLYCERIN, ondansetron (ZOFRAN) IV, zolpidem  Allergies:   No Known Allergies  Social History:   Social History   Socioeconomic History  . Marital status: Married    Spouse name: Not on file  . Number of children: Not on file  . Years of education: Not on file  . Highest education level: Not on file  Occupational History  . Not on file  Social Needs  . Financial resource strain: Not on file  . Food insecurity:    Worry: Not on file    Inability: Not on file  . Transportation needs:    Medical: Not on file    Non-medical: Not on file  Tobacco Use  . Smoking status: Former Games developer  . Smokeless tobacco: Never Used  Substance and Sexual Activity  . Alcohol use: Never  Frequency: Never  . Drug use: Never  . Sexual activity: Not on file  Lifestyle  . Physical activity:    Days per week: Not on file    Minutes per session: Not on file  . Stress: Not on file  Relationships  . Social connections:    Talks on phone: Not on file    Gets together: Not on file    Attends religious service: Not on file    Active member of club or organization: Not on file    Attends meetings of clubs or organizations: Not on file    Relationship status: Not on file  . Intimate partner violence:    Fear of current or ex partner: Not on file     Emotionally abused: Not on file    Physically abused: Not on file    Forced sexual activity: Not on file  Other Topics Concern  . Not on file  Social History Narrative  . Not on file    Family History:   Family History  Problem Relation Age of Onset  . Diabetes Mellitus II Mother   . Hypertension Mother   . Heart attack Mother      ROS:  Please see the history of present illness.  All other ROS reviewed and negative.     Physical Exam/Data:   Vitals:   07/02/18 1956 07/03/18 0537 07/03/18 0647 07/03/18 0900  BP: (!) 155/96 (!) 162/96 (!) 161/87 (!) 156/82  Pulse: (!) 55 62    Resp: 18 16    Temp: 98.2 F (36.8 C) 97.8 F (36.6 C)  98.1 F (36.7 C)  TempSrc: Oral Oral  Oral  SpO2: 95% 92%    Weight: 119.2 kg     Height: 6' (1.829 m)       Intake/Output Summary (Last 24 hours) at 07/03/2018 1413 Last data filed at 07/03/2018 0730 Gross per 24 hour  Intake 503.84 ml  Output 500 ml  Net 3.84 ml   Filed Weights   07/02/18 0926 07/02/18 1956  Weight: 122.5 kg 119.2 kg   Body mass index is 35.64 kg/m.  General:  Well nourished, well developed, in no acute distress HEENT: normal Lymph: no adenopathy Neck: no JVD Endocrine:  No thryomegaly Vascular: No carotid bruits; FA pulses 2+ bilaterally without bruits  Cardiac:  normal S1, S2; RRR; no murmur  Lungs:  clear to auscultation bilaterally, no wheezing, rhonchi or rales  Abd: soft, nontender, no hepatomegaly  Ext: no edema Musculoskeletal:  No deformities, BUE and BLE strength normal and equal Skin: warm and dry  Neuro:  CNs 2-12 intact, no focal abnormalities noted Psych:  Normal affect   Telemetry:  Telemetry was personally reviewed and demonstrates: Sinus rhythm with frequent PVC  Relevant CV Studies: None  Laboratory Data:  Chemistry Recent Labs  Lab 07/02/18 0947  NA 139  K 4.3  CL 106  CO2 26  GLUCOSE 86  BUN 18  CREATININE 1.00  CALCIUM 9.6  GFRNONAA >60  GFRAA >60  ANIONGAP 7      No results for input(s): PROT, ALBUMIN, AST, ALT, ALKPHOS, BILITOT in the last 168 hours. Hematology Recent Labs  Lab 07/02/18 0947  WBC 5.7  RBC 5.18  HGB 14.2  HCT 44.8  MCV 86.5  MCH 27.4  MCHC 31.7  RDW 13.0  PLT 222   Cardiac Enzymes Recent Labs  Lab 07/02/18 0947 07/02/18 1323 07/02/18 2221 07/03/18 0409 07/03/18 1002  TROPONINI <0.03 <0.03 <0.03 <0.03 <0.03  Radiology/Studies:  Dg Chest 2 View  Result Date: 07/02/2018 CLINICAL DATA:  62 year-old male c/o chest pressure, nausea, dizziness, HA, pain radiating to neck and right shoulder and elevated BP since Friday. Pt states he was told he had an MI in the past. Hx of DM, HTN meds EXAM: CHEST - 2 VIEW COMPARISON:  None. FINDINGS: The heart size and mediastinal contours are within normal limits. Both lungs are clear. The visualized skeletal structures are unremarkable. IMPRESSION: No active cardiopulmonary disease. Electronically Signed   By: Norva PavlovElizabeth  Brown M.D.   On: 07/02/2018 10:51   Assessment and Plan:   1.  Chest pain, concerning for unstable angina -Nitro responsive.  Patient ruled out.  Troponin x5 negative.  EKG with bifascicular block and nonspecific T wave inversion yesterday.  [facility] treated with medication.  Patient has multiple risk factors -hypertension, hyperlipidemia, diabetes, prior tobacco smoking and family history.  Plan coronary angiography today for definite evaluation of his symptoms. The patient understands that risks include but are not limited to stroke (1 in 1000), death (1 in 1000), kidney failure [usually temporary] (1 in 500), bleeding (1 in 200), allergic reaction [possibly serious] (1 in 200), and agrees to proceed.  -Continue aspirin.  Pharmacy to review his home medication prior to restart (reports taking beta-blocker and statin)  2.  Hypertension -Blood pressure elevated here.  He reports history of cough on lisinopril.   Will stop and change to Irbesartan.  Add medication based on cath finding and pharmacy review.  3.  Diabetes -A1c 5.4.  Per primary team.  4.  Hyperlipidemia -Pending review of home medication.  For questions or updates, please contact [facility] Please consult [email or web address] for contact info under     Lorelei PontSigned, Nailani Full, PA  07/03/2018 2:13 PM

## 2018-07-04 ENCOUNTER — Encounter (HOSPITAL_COMMUNITY): Payer: Self-pay | Admitting: Interventional Cardiology

## 2018-07-04 DIAGNOSIS — R072 Precordial pain: Secondary | ICD-10-CM | POA: Diagnosis not present

## 2018-07-04 LAB — T4, FREE: Free T4: 0.86 ng/dL (ref 0.82–1.77)

## 2018-07-04 LAB — MAGNESIUM: Magnesium: 1.8 mg/dL (ref 1.7–2.4)

## 2018-07-04 LAB — TSH: TSH: 1.677 u[IU]/mL (ref 0.350–4.500)

## 2018-07-04 LAB — GLUCOSE, CAPILLARY
GLUCOSE-CAPILLARY: 84 mg/dL (ref 70–99)
Glucose-Capillary: 84 mg/dL (ref 70–99)
Glucose-Capillary: 90 mg/dL (ref 70–99)

## 2018-07-04 MED ORDER — METOPROLOL SUCCINATE ER 50 MG PO TB24: 50.0000 mg | ORAL_TABLET | Freq: Every day | ORAL | 0 refills | Status: AC

## 2018-07-04 MED ORDER — NITROGLYCERIN 0.4 MG SL SUBL: 0.4000 mg | SUBLINGUAL_TABLET | SUBLINGUAL | 12 refills | Status: AC | PRN

## 2018-07-04 MED ORDER — IRBESARTAN 150 MG PO TABS: 150.0000 mg | ORAL_TABLET | Freq: Every day | ORAL | 3 refills | Status: AC

## 2018-07-04 MED ORDER — METOPROLOL SUCCINATE ER 50 MG PO TB24: 50.0000 mg | ORAL_TABLET | Freq: Every day | ORAL | Status: DC

## 2018-07-04 NOTE — Plan of Care (Signed)

## 2018-07-04 NOTE — Progress Notes (Addendum)
Progress Note  Patient Name: Bruce Turner Date of Encounter: 07/04/2018  Primary Cardiologist: Willette PaSees Mike Duran PA-C with [facility]  Subjective   Feeling fine this morning. No CP or SOB. Ambulated to BR without difficulty. Has been told he had skips in heartbeat before  Inpatient Medications    Scheduled Meds: . aspirin  81 mg Oral Daily  . atorvastatin  40 mg Oral q1800  . enoxaparin (LOVENOX) injection  40 mg Subcutaneous Q24H  . insulin aspart  0-5 Units Subcutaneous QHS  . insulin aspart  0-9 Units Subcutaneous TID WC  . irbesartan  150 mg Oral Daily  . sodium chloride flush  3 mL Intravenous Q12H   Continuous Infusions: . sodium chloride     PRN Meds: sodium chloride, acetaminophen, ALPRAZolam, hydrALAZINE, morphine injection, nitroGLYCERIN, ondansetron (ZOFRAN) IV, sodium chloride flush, zolpidem   Vital Signs    Vitals:   07/03/18 2200 07/03/18 2300 07/04/18 0000 07/04/18 0100  BP: 112/60 108/60 122/77 120/67  Pulse: 74 70 62 72  Resp: 17 18 18 15   Temp:      TempSrc:      SpO2: 91% 92% 91% 92%  Weight:      Height:        Intake/Output Summary (Last 24 hours) at 07/04/2018 0755 Last data filed at 07/04/2018 [id number] Gross per 24 hour  Intake 738 ml  Output 1275 ml  Net -537 ml   Filed Weights   07/02/18 1956 07/03/18 1519 07/03/18 1528  Weight: 119.2 kg 119.2 kg 118.7 kg    Telemetry    NSR freq PVCs, rare bigeminy, some nocturnal brady upper 40s - Personally Reviewed  Physical Exam   GEN: No acute distress.  HEENT: Normocephalic, atraumatic, sclera non-icteric. Neck: No JVD or bruits. Cardiac: RRR occasional ectopy no murmurs, rubs, or gallops.  Radials/DP/PT 1+ and equal bilaterally.  Respiratory: Clear to auscultation bilaterally. Breathing is unlabored. GI: Soft, nontender, non-distended, BS +x 4. MS: no deformity. Extremities: No clubbing or cyanosis. No edema. Distal pedal pulses are 2+ and equal bilaterally. Right radial cath  site without hematoma or ecchymosis; good pulse. Neuro:  AAOx3. Follows commands. Psych:  Responds to questions appropriately with a normal affect.  Labs    Chemistry Recent Labs  Lab 07/02/18 0947 07/03/18 1906  NA 139  --   K 4.3  --   CL 106  --   CO2 26  --   GLUCOSE 86  --   BUN 18  --   CREATININE 1.00 0.94  CALCIUM 9.6  --   GFRNONAA >60 >60  GFRAA >60 >60  ANIONGAP 7  --      Hematology Recent Labs  Lab 07/02/18 0947 07/03/18 1906  WBC 5.7 5.7  RBC 5.18 5.07  HGB 14.2 14.2  HCT 44.8 42.6  MCV 86.5 84.0  MCH 27.4 28.0  MCHC 31.7 33.3  RDW 13.0 12.8  PLT 222 216    Cardiac Enzymes Recent Labs  Lab 07/02/18 1323 07/02/18 2221 07/03/18 0409 07/03/18 1002  TROPONINI <0.03 <0.03 <0.03 <0.03   No results for input(s): TROPIPOC in the last 168 hours.   BNPNo results for input(s): BNP, PROBNP in the last 168 hours.   DDimer No results for input(s): DDIMER in the last 168 hours.   Radiology    Dg Chest 2 View  Result Date: 07/02/2018 CLINICAL DATA:  62 year-old male c/o chest pressure, nausea, dizziness, HA, pain radiating to neck and right shoulder and elevated BP  since Friday. Pt states he was told he had an MI in the past. Hx of DM, HTN meds EXAM: CHEST - 2 VIEW COMPARISON:  None. FINDINGS: The heart size and mediastinal contours are within normal limits. Both lungs are clear. The visualized skeletal structures are unremarkable. IMPRESSION: No active cardiopulmonary disease. Electronically Signed   By: Norva PavlovElizabeth  Brown M.D.   On: 07/02/2018 10:51    Cardiac Studies   Cardiac catheterization this admission, please see full report and below for summary.  Patient Profile     62 y.o. male with HTN, HLD, DM, OSA (does not use PCP), obesity, remote tobacco abuse was admitted with headache, chest pain and blood pressure elevation. Troponins were negative.  Cardiac cath 07/03/18 showed no significant CAD, EF ~50%, normal LVEDP, severely elevated BP noted.  Telemetry also notable for frequent PVCs.  Assessment & Plan    1. Chest pain - suspect related to HTN. Cath OK. Not tachycardic, tachypneic or hypoxic. He received both lisinopril and irbesartan yesterday. Was also on metoprolol QHS at home, will resume. Can consider OP Holter for quantification of PVCs plus echocardiogram if not previously performed - patient follows with Arnette FeltsMike Duran for his cardiology care and may have had prior testing to assess. Will add Mg and TSH to labs today. Recommend to replete Mg if 1.8 or less. We also discussed resumption of OSA therapy as OP.  2. HTN - BP normal this AM but also received both ACEI and ARB yesterday. He's had cough with lisinopril. Continue ARB. Resume home BB (takes QHS). This can be followed as OP.  3. RBBB + LAFB - he has mild nocturnal bradycardia likely related to OSA, but no specific events otherwise. Continue to monitor.  4. HLD - continue OP management.  For questions or updates, please contact [facility] Please consult [email or web address] for contact info under Cardiology/STEMI.  Signed, Laurann Montanaayna N Dunn, PA-C 07/04/2018, 7:55 AM     Patient seen and examined. Agree with assessment and plan.  Feels well today.  I reviewed the catheterization findings with him.  Catheterization showed only mild luminal irregularities.  Presently, telemetry reveals sinus rhythm in the 70s.  I discussed the cardiovascular complications of untreated sleep apnea which may be contributing to some of his nocturnal palpitations and bradycardia.  He should reinstitute CPAP therapy.  We will discontinue ACE inhibition secondary to prior cough and continue with irbesartan for ARB therapy.  Okay to discharge today.  I discussed with Dr. Sherryll BurgerShah.   Lennette Biharihomas A. Earl Zellmer, MD, [facility]ACC 07/04/2018 5:22 PM

## 2018-07-04 NOTE — Progress Notes (Signed)
Patient's TR band removed at 11:45pm. Site unremarkable. Patient denies pain, numbness, tingling. Right extremity warm and radial pulse +2. Will continue to monitor. Victorino DecemberGarnet A Oz Gammel, RN

## 2018-07-04 NOTE — Discharge Summary (Signed)
Physician Discharge Summary  Bruce Turner ZOX:[id number] DOB: 03/21/1956 DOA: 07/02/2018  PCP: [facility]  Admit date: 07/02/2018  Discharge date: 07/04/2018  Admitted From:Home  Disposition:  Home  Recommendations for Outpatient Follow-up:  1. Follow up with PCP in 1-2 weeks 2. Will need to continue on CPAP 3. Discontinued ace inhibitor and will continue on irbesartan  Home Health:None  Equipment/Devices:None  Discharge Condition:Stable  CODE STATUS: Full  Diet recommendation: Heart Healthy/Carb Modified  Brief/Interim Summary: Per HPI: Bruce Turner a 62 y.o.malewith medical history significant ofhypertension, hyperlipidemia, diabetes mellitus, who presents with chest pain and headache.  He had apositive nuclear stress test 6 months ago by Dr. Rudell Cobbaranin [place]. EKG showed T wave inversion in lead III/aVF and V4-V6.  Patient has ruled out for myocardial infarction by serial enzymes.  EKG unhelpful because of bifascicular block.  Patient was seen by Cardiology and he underwent left heart catheterization with EF 50% and widely patent coronary arteries noted. He was thought to have chest pain related to his hypertension. He is also likely having cardiovascular complications due to untreated sleep apnea which could give him nocturnal palpitations and bradycardia. Medication adjustments otherwise as noted below. No other acute events throughout this admission.  Discharge Diagnoses:  Principal Problem:   Precordial chest pain Active Problems:   Diabetes mellitus without complication (HCC)   High cholesterol   Hypertension   Sleep apnea in adult  Principle discharge diagnosis: Chest pain secondary to poorly controlled htn and osa.  Discharge Instructions  Discharge Instructions    Diet - low sodium heart healthy   Complete by:  As directed    Increase activity slowly   Complete by:  As directed      Allergies as of 07/04/2018      Reactions    Shrimp [shellfish Allergy] Anaphylaxis      Medication List    STOP taking these medications   lisinopril 10 MG tablet Commonly known as:  PRINIVIL,ZESTRIL     TAKE these medications   aspirin EC 81 MG tablet Take 81 mg by mouth daily.   atorvastatin 20 MG tablet Commonly known as:  LIPITOR Take 20 mg by mouth daily.   irbesartan 150 MG tablet Commonly known as:  AVAPRO Take 1 tablet (150 mg total) by mouth daily. Start taking on:  July 05, 2018   metoprolol succinate 50 MG 24 hr tablet Commonly known as:  TOPROL-XL Take 1 tablet (50 mg total) by mouth at bedtime. Take with or immediately following a meal. What changed:  when to take this   nitroGLYCERIN 0.4 MG SL tablet Commonly known as:  NITROSTAT Place 1 tablet (0.4 mg total) under the tongue every 5 (five) minutes as needed for chest pain.   sitaGLIPtin-metformin 50-1000 MG tablet Commonly known as:  JANUMET Take 1 tablet by mouth 2 (two) times daily with a meal.      Follow-up Information    [facility]   Contact information: [address] [place] [id number] [phone]          Allergies  Allergen Reactions  . Shrimp [Shellfish Allergy] Anaphylaxis    Consultations:  Cardiology   Procedures/Studies: Dg Chest 2 View  Result Date: 07/02/2018 CLINICAL DATA:  62 year-old male c/o chest pressure, nausea, dizziness, HA, pain radiating to neck and right shoulder and elevated BP since Friday. Pt states he was told he had an MI in the past. Hx of DM, HTN  meds EXAM: CHEST - 2 VIEW COMPARISON:  None. FINDINGS: The heart size and mediastinal contours are within normal limits. Both lungs are clear. The visualized skeletal structures are unremarkable. IMPRESSION: No active cardiopulmonary disease. Electronically Signed   By: Norva Pavlov M.D.   On: 07/02/2018 10:51     Discharge Exam: Vitals:   07/04/18 0937 07/04/18 1100  BP: (!) 154/85   Pulse: 70 67   Resp: 14 19  Temp:    SpO2: 95% 95%   Vitals:   07/04/18 0900 07/04/18 0935 07/04/18 0937 07/04/18 1100  BP:  (!) 155/90 (!) 154/85   Pulse: 71 77 70 67  Resp: 17 17 14 19   Temp:  98 F (36.7 C)    TempSrc:  Oral    SpO2: 94% 96% 95% 95%  Weight:      Height:        General: Pt is alert, awake, not in acute distress Cardiovascular: RRR, S1/S2 +, no rubs, no gallops Respiratory: CTA bilaterally, no wheezing, no rhonchi Abdominal: Soft, NT, ND, bowel sounds + Extremities: no edema, no cyanosis    The results of significant diagnostics from this hospitalization (including imaging, microbiology, ancillary and laboratory) are listed below for reference.     Microbiology: No results found for this or any previous visit (from the past 240 hour(s)).   Labs: BNP (last 3 results) No results for input(s): BNP in the last 8760 hours. Basic Metabolic Panel: Recent Labs  Lab 07/02/18 0947 07/03/18 1906 07/04/18 0808  NA 139  --   --   K 4.3  --   --   CL 106  --   --   CO2 26  --   --   GLUCOSE 86  --   --   BUN 18  --   --   CREATININE 1.00 0.94  --   CALCIUM 9.6  --   --   MG  --   --  1.8   Liver Function Tests: No results for input(s): AST, ALT, ALKPHOS, BILITOT, PROT, ALBUMIN in the last 168 hours. No results for input(s): LIPASE, AMYLASE in the last 168 hours. No results for input(s): AMMONIA in the last 168 hours. CBC: Recent Labs  Lab 07/02/18 0947 07/03/18 1906  WBC 5.7 5.7  HGB 14.2 14.2  HCT 44.8 42.6  MCV 86.5 84.0  PLT 222 216   Cardiac Enzymes: Recent Labs  Lab 07/02/18 0947 07/02/18 1323 07/02/18 2221 07/03/18 0409 07/03/18 1002  TROPONINI <0.03 <0.03 <0.03 <0.03 <0.03   BNP: Invalid input(s): POCBNP CBG: Recent Labs  Lab 07/03/18 1643 07/03/18 2044 07/04/18 0634 07/04/18 1104 07/04/18 1604  GLUCAP 82 170* 84 84 90   D-Dimer No results for input(s): DDIMER in the last 72 hours. Hgb A1c Recent Labs    07/03/18 0409  HGBA1C  5.4   Lipid Profile Recent Labs    07/03/18 0409  CHOL 135  HDL 43  LDLCALC 67  TRIG 124  CHOLHDL 3.1   Thyroid function studies Recent Labs    07/04/18 0808  TSH 1.677   Anemia work up No results for input(s): VITAMINB12, FOLATE, FERRITIN, TIBC, IRON, RETICCTPCT in the last 72 hours. Urinalysis No results found for: COLORURINE, APPEARANCEUR, LABSPEC, PHURINE, GLUCOSEU, HGBUR, BILIRUBINUR, KETONESUR, PROTEINUR, UROBILINOGEN, NITRITE, LEUKOCYTESUR Sepsis Labs Invalid input(s): PROCALCITONIN,  WBC,  LACTICIDVEN Microbiology No results found for this or any previous visit (from the past 240 hour(s)).   Time coordinating discharge: 35 minutes  SIGNED:  Brandis Wixted D Sherryll BurgerShah, DO [facility] 07/04/2018, 5:32 PM Pager [phone]  If 7PM-7AM, please contact night-coverage [email or web address] Password [email or web address]

## 2018-07-04 NOTE — Progress Notes (Signed)
Patient is discharged per MD orders. Discharge education and instructions provided to the patient, all questions and follow up instructions clearly explained to the patient. Teach back method used for patient to demonstrate clear understanding of discharge information. Telemetry and PIV discontinued per orders. Patient's wife will transport the patient home with all personal belongings.

## 2018-07-09 ENCOUNTER — Emergency Department (HOSPITAL_BASED_OUTPATIENT_CLINIC_OR_DEPARTMENT_OTHER)
Admission: EM | Admit: 2018-07-09 | Discharge: 2018-07-09 | Disposition: A | Discharge status: Home / Self Care | Payer: BLUE CROSS/BLUE SHIELD | Attending: Emergency Medicine | Admitting: Emergency Medicine

## 2018-07-09 ENCOUNTER — Emergency Department (HOSPITAL_BASED_OUTPATIENT_CLINIC_OR_DEPARTMENT_OTHER): Payer: BLUE CROSS/BLUE SHIELD

## 2018-07-09 ENCOUNTER — Other Ambulatory Visit: Payer: Self-pay

## 2018-07-09 ENCOUNTER — Encounter (HOSPITAL_BASED_OUTPATIENT_CLINIC_OR_DEPARTMENT_OTHER): Payer: Self-pay | Admitting: Emergency Medicine

## 2018-07-09 DIAGNOSIS — Y9389 Activity, other specified: Secondary | ICD-10-CM | POA: Insufficient documentation

## 2018-07-09 DIAGNOSIS — I10 Essential (primary) hypertension: Secondary | ICD-10-CM | POA: Diagnosis not present

## 2018-07-09 DIAGNOSIS — Z79899 Other long term (current) drug therapy: Secondary | ICD-10-CM | POA: Diagnosis not present

## 2018-07-09 DIAGNOSIS — S42035A Nondisplaced fracture of lateral end of left clavicle, initial encounter for closed fracture: Secondary | ICD-10-CM | POA: Diagnosis not present

## 2018-07-09 DIAGNOSIS — S3982XA Other specified injuries of lower back, initial encounter: Secondary | ICD-10-CM | POA: Insufficient documentation

## 2018-07-09 DIAGNOSIS — Z7982 Long term (current) use of aspirin: Secondary | ICD-10-CM | POA: Insufficient documentation

## 2018-07-09 DIAGNOSIS — Y999 Unspecified external cause status: Secondary | ICD-10-CM | POA: Diagnosis not present

## 2018-07-09 DIAGNOSIS — W19XXXA Unspecified fall, initial encounter: Secondary | ICD-10-CM

## 2018-07-09 DIAGNOSIS — S43112A Subluxation of left acromioclavicular joint, initial encounter: Secondary | ICD-10-CM | POA: Diagnosis not present

## 2018-07-09 DIAGNOSIS — E119 Type 2 diabetes mellitus without complications: Secondary | ICD-10-CM | POA: Diagnosis not present

## 2018-07-09 DIAGNOSIS — Y929 Unspecified place or not applicable: Secondary | ICD-10-CM | POA: Diagnosis not present

## 2018-07-09 DIAGNOSIS — S43005A Unspecified dislocation of left shoulder joint, initial encounter: Secondary | ICD-10-CM | POA: Diagnosis not present

## 2018-07-09 DIAGNOSIS — W11XXXA Fall on and from ladder, initial encounter: Secondary | ICD-10-CM | POA: Diagnosis not present

## 2018-07-09 DIAGNOSIS — S52122A Displaced fracture of head of left radius, initial encounter for closed fracture: Secondary | ICD-10-CM | POA: Insufficient documentation

## 2018-07-09 DIAGNOSIS — S4982XA Other specified injuries of left shoulder and upper arm, initial encounter: Secondary | ICD-10-CM | POA: Diagnosis present

## 2018-07-09 DIAGNOSIS — Z87891 Personal history of nicotine dependence: Secondary | ICD-10-CM | POA: Diagnosis not present

## 2018-07-09 DIAGNOSIS — S43102A Unspecified dislocation of left acromioclavicular joint, initial encounter: Secondary | ICD-10-CM

## 2018-07-09 LAB — CBC WITH DIFFERENTIAL/PLATELET
Abs Immature Granulocytes: 0.04 10*3/uL (ref 0.00–0.07)
Basophils Absolute: 0 10*3/uL (ref 0.0–0.1)
Basophils Relative: 0 %
Eosinophils Absolute: 0 10*3/uL (ref 0.0–0.5)
Eosinophils Relative: 0 %
HCT: 42.4 % (ref 39.0–52.0)
Hemoglobin: 13.3 g/dL (ref 13.0–17.0)
Immature Granulocytes: 0 %
Lymphocytes Relative: 7 %
Lymphs Abs: 0.8 10*3/uL (ref 0.7–4.0)
MCH: 27.4 pg (ref 26.0–34.0)
MCHC: 31.4 g/dL (ref 30.0–36.0)
MCV: 87.4 fL (ref 80.0–100.0)
Monocytes Absolute: 0.5 10*3/uL (ref 0.1–1.0)
Monocytes Relative: 4 %
Neutro Abs: 9.9 10*3/uL — ABNORMAL HIGH (ref 1.7–7.7)
Neutrophils Relative %: 89 %
Platelets: 208 10*3/uL (ref 150–400)
RBC: 4.85 MIL/uL (ref 4.22–5.81)
RDW: 12.8 % (ref 11.5–15.5)
WBC: 11.2 10*3/uL — ABNORMAL HIGH (ref 4.0–10.5)
nRBC: 0 % (ref 0.0–0.2)

## 2018-07-09 LAB — COMPREHENSIVE METABOLIC PANEL
ALT: 25 U/L (ref 0–44)
AST: 27 U/L (ref 15–41)
Albumin: 4 g/dL (ref 3.5–5.0)
Alkaline Phosphatase: 71 U/L (ref 38–126)
Anion gap: 5 (ref 5–15)
BUN: 18 mg/dL (ref 8–23)
CO2: 23 mmol/L (ref 22–32)
Calcium: 8.4 mg/dL — ABNORMAL LOW (ref 8.9–10.3)
Chloride: 107 mmol/L (ref 98–111)
Creatinine, Ser: 1.07 mg/dL (ref 0.61–1.24)
GFR calc Af Amer: >60 mL/min (ref >60)
GFR calc non Af Amer: >60 mL/min (ref >60)
Glucose, Bld: 110 mg/dL — ABNORMAL HIGH (ref 70–99)
Potassium: 3.7 mmol/L (ref 3.5–5.1)
Sodium: 135 mmol/L (ref 135–145)
Total Bilirubin: 0.6 mg/dL (ref 0.3–1.2)
Total Protein: 7.2 g/dL (ref 6.5–8.1)

## 2018-07-09 MED ORDER — IOPAMIDOL (ISOVUE-300) INJECTION 61%
100.0000 mL | Freq: Once | INTRAVENOUS | Status: AC | PRN
  Administered 2018-07-09: 100 mL via INTRAVENOUS

## 2018-07-09 MED ORDER — HYDROMORPHONE HCL 1 MG/ML IJ SOLN
1.0000 mg | Freq: Once | INTRAMUSCULAR | Status: AC
  Administered 2018-07-09: 1 mg via INTRAVENOUS
  Filled 2018-07-09: qty 1

## 2018-07-09 MED ORDER — ONDANSETRON HCL 4 MG/2ML IJ SOLN
INTRAMUSCULAR | Status: AC
  Filled 2018-07-09: qty 2

## 2018-07-09 MED ORDER — MORPHINE SULFATE (PF) 4 MG/ML IV SOLN
4.0000 mg | Freq: Once | INTRAVENOUS | Status: AC
  Administered 2018-07-09: 4 mg via INTRAVENOUS
  Filled 2018-07-09: qty 1

## 2018-07-09 MED ORDER — ETOMIDATE 2 MG/ML IV SOLN
INTRAVENOUS | Status: AC
  Filled 2018-07-09: qty 10

## 2018-07-09 MED ORDER — MORPHINE SULFATE (PF) 4 MG/ML IV SOLN
4.0000 mg | Freq: Once | INTRAVENOUS | Status: AC
  Administered 2018-07-09: 4 mg via INTRAVENOUS

## 2018-07-09 MED ORDER — ETOMIDATE 2 MG/ML IV SOLN
15.0000 mg | Freq: Once | INTRAVENOUS | Status: AC
  Administered 2018-07-09: 15 mg via INTRAVENOUS

## 2018-07-09 MED ORDER — ONDANSETRON HCL 4 MG/2ML IJ SOLN
4.0000 mg | Freq: Once | INTRAMUSCULAR | Status: AC
  Administered 2018-07-09: 4 mg via INTRAVENOUS

## 2018-07-09 MED ORDER — MORPHINE SULFATE (PF) 4 MG/ML IV SOLN
INTRAVENOUS | Status: AC
  Filled 2018-07-09: qty 1

## 2018-07-09 MED ORDER — OXYCODONE-ACETAMINOPHEN 5-325 MG PO TABS: 1.0000 | ORAL_TABLET | Freq: Four times a day (QID) | ORAL | 0 refills | Status: AC | PRN

## 2018-07-09 MED ORDER — FENTANYL CITRATE (PF) 100 MCG/2ML IJ SOLN
50.0000 ug | INTRAMUSCULAR | Status: DC | PRN
  Administered 2018-07-09: 50 ug via INTRAVENOUS
  Filled 2018-07-09: qty 2

## 2018-07-09 MED ORDER — ONDANSETRON HCL 4 MG/2ML IJ SOLN
4.0000 mg | Freq: Once | INTRAMUSCULAR | Status: AC
  Administered 2018-07-09: 4 mg via INTRAVENOUS
  Filled 2018-07-09: qty 2

## 2018-07-09 NOTE — ED Triage Notes (Signed)
Pt reports falling 10 feet off of a ladder, landing on his back. C/o L shoulder pain. Denies LOC, neck or back pain.

## 2018-07-09 NOTE — ED Provider Notes (Signed)
.Sedation Date/Time: 07/09/2018 3:41 PM Performed by: Rolan BuccoBelfi, Hatice Bubel, MD Authorized by: Rolan BuccoBelfi, Suella Cogar, MD   Consent:    Consent obtained:  Written   Consent given by:  Patient   Risks discussed:  Allergic reaction, inadequate sedation, vomiting, prolonged hypoxia resulting in organ damage, prolonged sedation necessitating reversal and respiratory compromise necessitating ventilatory assistance and intubation   Alternatives discussed:  Analgesia without sedation Universal protocol:    Procedure explained and questions answered to patient or proxy's satisfaction: yes     Relevant documents present and verified: yes     Test results available and properly labeled: yes     Imaging studies available: yes     Required blood products, implants, devices, and special equipment available: yes     Site/side marked: yes     Immediately prior to procedure a time out was called: yes   Indications:    Procedure performed:  Dislocation reduction   Procedure necessitating sedation performed by:  Physician performing sedation Pre-sedation assessment:    Time since last food or drink:  5 hours   ASA classification: class 2 - patient with mild systemic disease     Neck mobility: normal     Mouth opening:  3 or more finger widths   Mallampati score:  III - soft palate, base of uvula visible   Pre-sedation assessments completed and reviewed: airway patency, cardiovascular function, hydration status, mental status, nausea/vomiting, pain level, respiratory function and temperature     Pre-sedation assessment completed:  07/09/2018 3:10 PM Immediate pre-procedure details:    Reassessment: Patient reassessed immediately prior to procedure     Reviewed: vital signs and NPO status     Verified: bag valve mask available, emergency equipment available, intubation equipment available, IV patency confirmed, oxygen available and suction available   Procedure details (see MAR for exact dosages):    Preoxygenation:   Nasal cannula   Sedation:  Etomidate   Intra-procedure monitoring:  Blood pressure monitoring, continuous capnometry, continuous pulse oximetry, cardiac monitor, frequent vital sign checks and frequent LOC assessments   Intra-procedure events: none     Total Provider sedation time (minutes):  10 Post-procedure details:    Post-sedation assessment completed:  07/09/2018 3:43 PM   Attendance: Constant attendance by certified staff until patient recovered     Recovery: Patient returned to pre-procedure baseline     Post-sedation assessments completed and reviewed: airway patency, cardiovascular function, hydration status, mental status, nausea/vomiting, pain level and respiratory function     Patient is stable for discharge or admission: yes     Patient tolerance:  Tolerated well, no immediate complications  Reduction of dislocation Date/Time: 07/09/2018 3:47 PM Performed by: Rolan BuccoBelfi, Tamisha Nordstrom, MD Authorized by: Rolan BuccoBelfi, Ren Aspinall, MD  Consent: Written consent obtained. Risks and benefits: risks, benefits and alternatives were discussed Consent given by: patient Patient understanding: patient states understanding of the procedure being performed Patient consent: the patient's understanding of the procedure matches consent given Procedure consent: procedure consent matches procedure scheduled Relevant documents: relevant documents present and verified Test results: test results available and properly labeled Imaging studies: imaging studies available Patient identity confirmed: verbally with patient Time out: Immediately prior to procedure a "time out" was called to verify the correct patient, procedure, equipment, support staff and site/side marked as required. Local anesthesia used: no  Anesthesia: Local anesthesia used: no  Sedation: Patient sedated: yes Sedation type: moderate (conscious) sedation Sedatives: etomidate Sedation start date/time: 07/09/2018 3:49 PM Sedation end date/time:  07/09/2018 3:59 PM Vitals: Vital  signs were monitored during sedation.  Patient tolerance: Patient tolerated the procedure well with no immediate complications    Patient presents after a fall off of the ladder.  He has evidence of a left shoulder dislocation and a probable radial head fracture.  There is also some AC separation and a small chip off his clavicle.  The reduction was reduced under procedural sedation.  He was placed in a sugar tong splint and shoulder sling.  He did develop some redness/questionable ecchymosis across his abdomen.  He does not have any other apparent injuries however given the significance of the fall, will likely image his abdomen.   Rolan Bucco, MD 07/09/18 1550

## 2018-07-09 NOTE — ED Notes (Signed)
Upon further exam blanchable purple discoloration noted to bilateral flanks. Pt denies abd pain and no distress noted. EDP aware new orders placed.

## 2018-07-09 NOTE — ED Provider Notes (Signed)
[facility] EMERGENCY DEPARTMENT Provider Note   CSN: [id number] Arrival date & time: 07/09/18  1306     History   Chief Complaint Chief Complaint  Patient presents with  . Fall    HPI Alcide CleverHarold Walgren is a 63 y.o. male with history of hypertension, diabetes who presents with left shoulder and elbow pain after falling from a 10 foot ladder.  Patient did not hit his head or lose consciousness.  He fell flat on his back.  He has no pain elsewhere other than in his shoulder and elbow.  He denies any chest pain, shortness of breath, abdominal pain, nausea, vomiting, numbness or tingling.  No medications taken prior to arrival.  HPI  Past Medical History:  Diagnosis Date  . Diabetes mellitus without complication (HCC)   . High cholesterol   . Hypertension     Patient Active Problem List   Diagnosis Date Noted  . Sleep apnea in adult   . Precordial chest pain 07/02/2018  . Diabetes mellitus without complication (HCC)   . High cholesterol   . Hypertension     Past Surgical History:  Procedure Laterality Date  . CHOLECYSTECTOMY    . LEFT HEART CATH AND CORONARY ANGIOGRAPHY N/A 07/03/2018   Procedure: LEFT HEART CATH AND CORONARY ANGIOGRAPHY;  Surgeon: Lyn RecordsSmith, Henry W, MD;  Location: [facility] INVASIVE CV LAB;  Service: Cardiovascular;  Laterality: N/A;  . ULTRASOUND GUIDANCE FOR VASCULAR ACCESS  07/03/2018   Procedure: Ultrasound Guidance For Vascular Access;  Surgeon: Lyn RecordsSmith, Henry W, MD;  [facility];  Service: Cardiovascular;;        Home Medications    Prior to Admission medications   Medication Sig Start Date End Date Taking? Authorizing Provider  aspirin EC 81 MG tablet Take 81 mg by mouth daily.    [provider]  atorvastatin (LIPITOR) 20 MG tablet Take 20 mg by mouth daily.    [provider]  irbesartan (AVAPRO) 150 MG tablet Take 1 tablet (150 mg total) by mouth daily. 07/05/18 08/04/18  Sherryll BurgerShah, Pratik D, DO  metoprolol succinate  (TOPROL-XL) 50 MG 24 hr tablet Take 1 tablet (50 mg total) by mouth at bedtime. Take with or immediately following a meal. 07/04/18 08/03/18  Sherryll BurgerShah, Pratik D, DO  nitroGLYCERIN (NITROSTAT) 0.4 MG SL tablet Place 1 tablet (0.4 mg total) under the tongue every 5 (five) minutes as needed for chest pain. 07/04/18 08/03/18  Sherryll BurgerShah, Pratik D, DO  oxyCODONE-acetaminophen (PERCOCET/ROXICET) 5-325 MG tablet Take 1-2 tablets by mouth every 6 (six) hours as needed for severe pain. 07/09/18   Elliemae Braman, Waylan BogaAlexandra M, PA-C  sitaGLIPtin-metformin (JANUMET) 50-1000 MG tablet Take 1 tablet by mouth 2 (two) times daily with a meal.    [provider]    Family History Family History  Problem Relation Age of Onset  . Diabetes Mellitus II Mother   . Hypertension Mother   . Heart attack Mother     Social History Social History   Tobacco Use  . Smoking status: Former Games developermoker  . Smokeless tobacco: Never Used  Substance Use Topics  . Alcohol use: Never    Frequency: Never  . Drug use: Never     Allergies   Shrimp [shellfish allergy]   Review of Systems Review of Systems  Constitutional: Negative for chills and fever.  HENT: Negative for facial swelling and sore throat.   Respiratory: Negative for shortness of breath.   Cardiovascular: Negative for chest pain.  Gastrointestinal: Negative for  abdominal pain, nausea and vomiting.  Genitourinary: Negative for dysuria.  Musculoskeletal: Positive for arthralgias and joint swelling. Negative for back pain.  Skin: Negative for rash and wound.  Neurological: Negative for headaches.  Psychiatric/Behavioral: The patient is not nervous/anxious.      Physical Exam Updated Vital Signs BP (!) 154/90   Pulse 79   Temp 98.2 F (36.8 C) (Oral)   Resp 11   Ht 6' (1.829 m)   Wt 117.9 kg   SpO2 90%   BMI 35.26 kg/m   Physical Exam Vitals signs and nursing note reviewed.  Constitutional:      General: He is not in acute distress.    Appearance: He is  well-developed. He is not diaphoretic.  HENT:     Head: Normocephalic and atraumatic.     Mouth/Throat:     Pharynx: No oropharyngeal exudate.  Eyes:     General: No scleral icterus.       Right eye: No discharge.        Left eye: No discharge.     Conjunctiva/sclera: Conjunctivae normal.     Pupils: Pupils are equal, round, and reactive to light.  Neck:     Musculoskeletal: Normal range of motion and neck supple.     Thyroid: No thyromegaly.  Cardiovascular:     Rate and Rhythm: Normal rate and regular rhythm.     Heart sounds: Normal heart sounds. No murmur. No friction rub. No gallop.   Pulmonary:     Effort: Pulmonary effort is normal. No respiratory distress.     Breath sounds: Normal breath sounds. No stridor. No wheezing or rales.  Abdominal:     General: Bowel sounds are normal. There is no distension.     Palpations: Abdomen is soft.     Tenderness: There is no abdominal tenderness. There is no guarding or rebound.  Musculoskeletal:     Comments: Deformity noted to left shoulder with shoulder and elbow tenderness, no tenderness to the forearm or hand  Lymphadenopathy:     Cervical: No cervical adenopathy.  Skin:    General: Skin is warm and dry.     Coloration: Skin is not pale.     Findings: No rash.  Neurological:     Mental Status: He is alert.     Coordination: Coordination normal.      ED Treatments / Results  Labs (all labs ordered are listed, but only abnormal results are displayed) Labs Reviewed  CBC WITH DIFFERENTIAL/PLATELET - Abnormal; Notable for the following components:      Result Value   WBC 11.2 (*)    Neutro Abs 9.9 (*)    All other components within normal limits  COMPREHENSIVE METABOLIC PANEL - Abnormal; Notable for the following components:   Glucose, Bld 110 (*)    Calcium 8.4 (*)    All other components within normal limits    EKG None  Radiology Dg Elbow 2 Views Left  Result Date: 07/09/2018 CLINICAL DATA:  Fall 10 feet from  ladder landing on back with left shoulder and elbow pain. EXAM: LEFT ELBOW - 2 VIEW COMPARISON:  None. FINDINGS: Examination demonstrates a minimally displaced radial head fracture. Subtle cortical irregularity along the anterior aspect of the distal humerus at the elbow joint which may represent a nondisplaced condylar fracture. Remaining bones and soft tissues are unremarkable. IMPRESSION: Minimally displaced radial head fracture. Subtle cortical irregularity along the anterior aspect of the distal humeral condyle as this may represent a nondisplaced fracture.  Four view elbow series may be helpful for further evaluation. Electronically Signed   By: Elberta Fortis M.D.   On: 07/09/2018 14:23   Ct Abdomen Pelvis W Contrast  Result Date: 07/09/2018 CLINICAL DATA:  Fell 10 feet from ladder. LEFT arm pain. History of cholecystectomy. EXAM: CT ABDOMEN AND PELVIS WITH CONTRAST TECHNIQUE: Multidetector CT imaging of the abdomen and pelvis was performed using the standard protocol following bolus administration of intravenous contrast. CONTRAST:  ISOVUE-300 IOPAMIDOL (ISOVUE-300) INJECTION 61% COMPARISON:  None. FINDINGS: LOWER CHEST: 6 mm lingula nodule (series 4, image 7), 5 mm RIGHT middle lobe ground-glass nodule (series 4, image 4), 6 mm RIGHT lower lobe lateral segment pulmonary nodule (series 4, image 6). Included heart size is normal. No pericardial effusion. Dependent atelectasis. HEPATOBILIARY: Status post cholecystectomy. Minimal postoperative intrahepatic biliary dilatation, liver is otherwise unremarkable. PANCREAS: Normal. SPLEEN: Normal. ADRENALS/URINARY TRACT: Kidneys are orthotopic, demonstrating symmetric enhancement. No nephrolithiasis, hydronephrosis or solid renal masses. Too small to characterize hypodensity lower pole RIGHT kidney. The unopacified ureters are normal in course and caliber. Delayed imaging through the kidneys demonstrates symmetric prompt contrast excretion within the proximal  urinary collecting system. Urinary bladder is partially distended and unremarkable. Normal adrenal glands. STOMACH/BOWEL: The stomach, small and large bowel are normal in course and caliber without inflammatory changes, sensitivity decreased without oral contrast. Mild sigmoid colonic diverticulosis. Small duodenal air-filled diverticulum. Normal appendix. VASCULAR/LYMPHATIC: Aortoiliac vessels are normal in course and caliber. 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 Mild calcific atherosclerosis. No lymphadenopathy by CT size criteria. REPRODUCTIVE: Normal. OTHER: No intraperitoneal free fluid or free air. Small fat containing umbilical hernia. Subcentimeter calcific granuloma LEFT pelvis. MUSCULOSKELETAL: Nonacute. Moderate to severe lower lumbar spondylosis, severe bilateral L5-S1 and RIGHT L4-5 neural foraminal narrowing. IMPRESSION: 1. No CT findings of acute trauma nor acute intra-abdominal/pelvic process. 2. **An incidental finding of potential clinical significance has been found. Multiple pulmonary nodules measuring to 6 mm. Non-contrast chest CT at 3-6 months is recommended. If the nodules are stable at time of repeat CT, then future CT at 18-24 months (from today's scan) is considered optional for low-risk patients, but is recommended for high-risk patients. This recommendation follows the consensus statement: Guidelines for Management of Incidental Pulmonary Nodules Detected on CT Images: From the Fleischner Society 2017; Radiology 2017; 284:228-243.** Aortic Atherosclerosis (ICD10-I70.0). Electronically Signed   By: Awilda Metro M.D.   On: 07/09/2018 16:35   Dg Shoulder Left  Addendum Date: 07/09/2018   ADDENDUM REPORT: 07/09/2018 16:01 ADDENDUM: Suggestion of a chip fracture along the undersurface of the distal clavicle as described on the post reduction films. Electronically Signed   By: Elberta Fortis M.D.   On: 07/09/2018 16:01   Result Date: 07/09/2018 CLINICAL DATA:  Fall 10  feet from ladder landing on back with left shoulder pain. EXAM: LEFT SHOULDER - 2+ VIEW COMPARISON:  Chest x-ray 07/02/2018 FINDINGS: Examination demonstrates anterior shoulder dislocation. No acute fracture. Widening of the left AC joint. IMPRESSION: Anterior shoulder dislocation.  No definite fracture. Widened AC joint which may represent grade 2 separation. Electronically Signed: By: Elberta Fortis M.D. On: 07/09/2018 14:20   Dg Shoulder Left Portable  Result Date: 07/09/2018 CLINICAL DATA:  Post reduction. EXAM: LEFT SHOULDER - 1 VIEW COMPARISON:  Earlier same day. FINDINGS: There is normal alignment over the glenohumeral joint post reduction of patient's previously seen anterior shoulder dislocation. Possible chip fracture along the undersurface of the distal clavicle. Interval decrease in the [facility] joint space, although the  distal clavicles appears slightly higher than the adjacent chromium. IMPRESSION: Normal alignment post reduction of previously seen anterior shoulder dislocation. Chip fracture along the undersurface of the distal clavicle. Previous seen widened AC joint has been reduced although distal clavicle remains slightly higher than the acromion. Electronically Signed   By: Elberta Fortis M.D.   On: 07/09/2018 15:35    Procedures Procedures (including critical care time)  Medications Ordered in ED Medications  ondansetron (ZOFRAN) injection 4 mg (4 mg Intravenous Given 07/09/18 1346)  morphine 4 MG/ML injection 4 mg (4 mg Intravenous Given 07/09/18 1436)  ondansetron (ZOFRAN) injection 4 mg (4 mg Intravenous Given 07/09/18 1435)  etomidate (AMIDATE) injection 15 mg (15 mg Intravenous Given 07/09/18 1513)  morphine 4 MG/ML injection 4 mg (4 mg Intravenous Given 07/09/18 1528)  HYDROmorphone (DILAUDID) injection 1 mg (1 mg Intravenous Given 07/09/18 1555)  iopamidol (ISOVUE-300) 61 % injection 100 mL (100 mLs Intravenous Contrast Given 07/09/18 1607)     Initial Impression / Assessment and Plan / ED  Course  I have reviewed the triage vital signs and the nursing notes.  Pertinent labs & imaging results that were available during my care of the patient were reviewed by me and considered in my medical decision making (see chart for details).  Clinical Course as of Jul 09 2018  Wynelle Link Jul 09, 2018  1601 Throughout ED course and during the sedation and reduction, a ecchymosis-like rash appearing to periumbilical abdomen and right flank.  It is nontender.  Patient did have some myoclonic muscle spasms with etomidate and may have pinched and scratched his abdomen, however considering the mechanism of injury, will scan the abdomen to r/o bleeding from trauma.   [AL]    Clinical Course User Index [AL] Emi Holes, PA-C    Patient presenting with left shoulder pain after fall.  He did not hit his head or lose consciousness.  He is neurovascularly intact.  Patient found to have an anterior shoulder dislocation, radial head fracture, chip fracture of his clavicle, and AC separation.  Patient's shoulder was reduced under sedation with etomidate.  Dr. Fredderick Phenix assisted and you can see the sedation procedure note in her note.  CT abdomen pelvis is negative for trauma findings, however there were incidental findings of lung nodules.  Patient made aware of this.  Sugar tong placed for radial head fracture.  Patient placed in arm sling.  Patient will be referred to orthopedics for further evaluation and treatment of all of his injuries.  Patient will be discharged home with short course of Percocet for pain control.  I reviewed the [place] narcotic database and found no discrepancies.  Ice discussed.  Return precautions discussed.  Patient understands and agrees with plan.  Patient vital stable throughout ED course and discharged in satisfactory condition.  Patient also evaluated by my attending, Dr. Fredderick Phenix, who guided the patient's management and agrees with plan.  Final Clinical Impressions(s) / ED Diagnoses   Final  diagnoses:  Fall, initial encounter  Shoulder dislocation, left, initial encounter  Closed displaced fracture of head of left radius, initial encounter  Closed nondisplaced fracture of acromial end of left clavicle, initial encounter  Separation of left acromioclavicular joint, type 2, initial encounter    ED Discharge Orders         Ordered    oxyCODONE-acetaminophen (PERCOCET/ROXICET) 5-325 MG tablet  Every 6 hours PRN     07/09/18 1703           Hennessey Cantrell, [employer]  Blair Heys 07/09/18 2020    Rolan Bucco, MD 07/18/18 1501

## 2018-07-09 NOTE — Discharge Instructions (Addendum)
Take Percocet every 6 hours as needed for severe pain.  Use ice 3-4 times daily alternating 20 minutes on, 20 minutes off.  Please follow-up with orthopedic doctor for further evaluation and treatment of your injuries.  Please return to the emergency department develop any new or worsening symptoms.  Please follow-up with your primary care provider for further work-up of the below incidental finding:  **An incidental finding of potential clinical significance has been found. Multiple pulmonary nodules measuring to 6 mm. Non-contrast chest CT at 3-6 months is recommended. If the nodules are stable at time of repeat CT, then future CT at 18-24 months (from today's scan) is considered optional for low-risk patients, but is recommended for high-risk patients.**  Do not drink alcohol, drive, operate machinery or participate in any other potentially dangerous activities while taking opiate pain medication as it may make you sleepy. Do not take this medication with any other sedating medications, either prescription or over-the-counter. If you were prescribed Percocet or Vicodin, do not take these with acetaminophen (Tylenol) as it is already contained within these medications and overdose of Tylenol is dangerous.   This medication is an opiate (or narcotic) pain medication and can be habit forming.  Use it as little as possible to achieve adequate pain control.  Do not use or use it with extreme caution if you have a history of opiate abuse or dependence. This medication is intended for your use only - do not give any to anyone else and keep it in a secure place where nobody else, especially children, have access to it. It will also cause or worsen constipation, so you may want to consider taking an over-the-counter stool softener while you are taking this medication.

## 2018-07-09 NOTE — ED Notes (Addendum)
Blanchable area of errythema noted to mid abdomen during the procedure. Pt had been holding arm in area, but was noted by EDP that it was not present on initial exam. Pt now A/Ox4, denies any abd pain or tenderness, pt in NAD.

## 2018-07-09 NOTE — ED Notes (Signed)
Patient transported to CT 

## 2018-07-10 ENCOUNTER — Ambulatory Visit (INDEPENDENT_AMBULATORY_CARE_PROVIDER_SITE_OTHER): Payer: BLUE CROSS/BLUE SHIELD | Admitting: Orthopaedic Surgery

## 2018-07-10 ENCOUNTER — Encounter (INDEPENDENT_AMBULATORY_CARE_PROVIDER_SITE_OTHER): Payer: Self-pay | Admitting: Orthopaedic Surgery

## 2018-07-10 VITALS — BP 150/93 | HR 94 | Ht 72.0 in | Wt 260.0 lb

## 2018-07-10 DIAGNOSIS — S43016A Anterior dislocation of unspecified humerus, initial encounter: Secondary | ICD-10-CM

## 2018-07-10 DIAGNOSIS — S52125A Nondisplaced fracture of head of left radius, initial encounter for closed fracture: Secondary | ICD-10-CM

## 2018-07-10 DIAGNOSIS — S43102A Unspecified dislocation of left acromioclavicular joint, initial encounter: Secondary | ICD-10-CM

## 2018-07-10 DIAGNOSIS — S43015S Anterior dislocation of left humerus, sequela: Secondary | ICD-10-CM | POA: Insufficient documentation

## 2018-07-10 NOTE — Progress Notes (Addendum)
Office Visit Note   Patient: Bruce Turner           Date of Birth: 04-16-1956           MRN: [id number] Visit Date: 07/10/2018              Requested by: Center, [facility] [address] [place], [place] [address] PCP: Center, [facility]   Assessment & Plan: Visit Diagnoses:  1. Acromioclavicular separation, left, initial encounter   2. Anterior shoulder dislocation, initial encounter   3. Closed nondisplaced fracture of head of left radius, initial encounter     Plan: Continue sling and splint.  Reviewed x-rays and discussed his diagnosis.  Work slip given no work x3 weeks.  We will recheck him in 1 week and should be able start some elbow range of motion at that time.  We discussed the possibility of shoulder stabilization procedure being necessary if he has persistent problems with shoulder instability.  He has oxycodone 15 tablets for pain he can add some Aleve or ibuprofen which he has at home in addition and continue ice.  Recheck 8 days. GLOBAL Follow-Up Instructions: Return in about 8 days (around 07/18/2018).   Orders:  No orders of the defined types were placed in this encounter.  No orders of the defined types were placed in this encounter.     Procedures: No procedures performed   Clinical Data: No additional findings.   Subjective: Chief Complaint  Patient presents with  . Left Shoulder - Fracture    DOI 07/09/2018  . 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  He suffered a left shoulder separation with distal clavicle chip avulsion fracture type II.  This is a closed injury.  He also suffered a left anterior shoulder dislocation which was reduced in the emergency room by the ER staff with sedation.  X-rays of his  elbow showed nondisplaced radial head fracture with some old changes consistent with his previous elbow injury from about 13.  Review of Systems positive for type 2 diabetes on oral medication.  Positive hypertension hypercholesterol sleep apnea.  Increased BMI.   Objective: Vital Signs: BP (!) 150/93   Pulse 94   Ht 6' (1.829 m)   Wt 260 lb (117.9 kg)   BMI 35.26 kg/m   Physical Exam Constitutional:      Appearance: He is well-developed.  HENT:     Head: Normocephalic and atraumatic.  Eyes:     Pupils: Pupils are equal, round, and reactive to light.  Neck:     Thyroid: No thyromegaly.     Trachea: No tracheal deviation.  Cardiovascular:     Rate and Rhythm: Normal rate.  Pulmonary:     Effort: Pulmonary effort is normal.     Breath sounds: No wheezing.  Abdominal:     General: Bowel sounds are normal.     Palpations: Abdomen is soft.  Skin:    General: Skin is warm and dry.     Capillary Refill: Capillary refill takes less than 2 seconds.  Neurological:     Mental Status: He is alert and oriented to person, place, and time.  Psychiatric:        Behavior:  Behavior normal.        Thought Content: Thought content normal.        Judgment: Judgment normal.     Ortho Exam patient has intact radial median and ulnar sensation to the left hand.  Intact motor ulnar to the hand interossei are normal good finger flexion.  Minimal swelling of the left hand.  No ecchymosis over the shoulder.  High riding the distal clavicle.  Specialty Comments:  No specialty comments available.  Imaging: Dg Elbow 2 Views Left  Result Date: 07/09/2018 CLINICAL DATA:  Fall 10 feet from ladder landing on back with left shoulder and elbow pain. EXAM: LEFT ELBOW - 2 VIEW COMPARISON:  None. FINDINGS: Examination demonstrates a minimally displaced radial head fracture. Subtle cortical irregularity along the anterior aspect of the distal humerus at the elbow joint which may represent a nondisplaced  condylar fracture. Remaining bones and soft tissues are unremarkable. IMPRESSION: Minimally displaced radial head fracture. Subtle cortical irregularity along the anterior aspect of the distal humeral condyle as this may represent a nondisplaced fracture. Four view elbow series may be helpful for further evaluation. Electronically Signed   By: Elberta Fortis M.D.   On: 07/09/2018 14:23   Ct Abdomen Pelvis W Contrast  Result Date: 07/09/2018 CLINICAL DATA:  Fell 10 feet from ladder. LEFT arm pain. History of cholecystectomy. EXAM: CT ABDOMEN AND PELVIS WITH CONTRAST TECHNIQUE: Multidetector CT imaging of the abdomen and pelvis was performed using the standard protocol following bolus administration of intravenous contrast. CONTRAST:  ISOVUE-300 IOPAMIDOL (ISOVUE-300) INJECTION 61% COMPARISON:  None. FINDINGS: LOWER CHEST: 6 mm lingula nodule (series 4, image 7), 5 mm RIGHT middle lobe ground-glass nodule (series 4, image 4), 6 mm RIGHT lower lobe lateral segment pulmonary nodule (series 4, image 6). Included heart size is normal. No pericardial effusion. Dependent atelectasis. HEPATOBILIARY: Status post cholecystectomy. Minimal postoperative intrahepatic biliary dilatation, liver is otherwise unremarkable. PANCREAS: Normal. SPLEEN: Normal. ADRENALS/URINARY TRACT: Kidneys are orthotopic, demonstrating symmetric enhancement. No nephrolithiasis, hydronephrosis or solid renal masses. Too small to characterize hypodensity lower pole RIGHT kidney. The unopacified ureters are normal in course and caliber. Delayed imaging through the kidneys demonstrates symmetric prompt contrast excretion within the proximal urinary collecting system. Urinary bladder is partially distended and unremarkable. Normal adrenal glands. STOMACH/BOWEL: The stomach, small and large bowel are normal in course and caliber without inflammatory changes, sensitivity decreased without oral contrast. Mild sigmoid colonic diverticulosis. Small  duodenal air-filled diverticulum. Normal appendix. VASCULAR/LYMPHATIC: Aortoiliac vessels are normal in course and caliber. 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 Mild calcific atherosclerosis. No lymphadenopathy by CT size criteria. REPRODUCTIVE: Normal. OTHER: No intraperitoneal free fluid or free air. Small fat containing umbilical hernia. Subcentimeter calcific granuloma LEFT pelvis. MUSCULOSKELETAL: Nonacute. Moderate to severe lower lumbar spondylosis, severe bilateral L5-S1 and RIGHT L4-5 neural foraminal narrowing. IMPRESSION: 1. No CT findings of acute trauma nor acute intra-abdominal/pelvic process. 2. **An incidental finding of potential clinical significance has been found. Multiple pulmonary nodules measuring to 6 mm. Non-contrast chest CT at 3-6 months is recommended. If the nodules are stable at time of repeat CT, then future CT at 18-24 months (from today's scan) is considered optional for low-risk patients, but is recommended for high-risk patients. This recommendation follows the consensus statement: Guidelines for Management of Incidental Pulmonary Nodules Detected on CT Images: From the Fleischner Society 2017; Radiology 2017; 284:228-243.** Aortic Atherosclerosis (ICD10-I70.0). Electronically Signed   By: Awilda Metro M.D.   On: 07/09/2018 16:35   Dg  Shoulder Left  Addendum Date: 07/09/2018   ADDENDUM REPORT: 07/09/2018 16:01 ADDENDUM: Suggestion of a chip fracture along the undersurface of the distal clavicle as described on the post reduction films. Electronically Signed   By: Elberta Fortisaniel  Boyle M.D.   On: 07/09/2018 16:01   Result Date: 07/09/2018 CLINICAL DATA:  Fall 10 feet from ladder landing on back with left shoulder pain. EXAM: LEFT SHOULDER - 2+ VIEW COMPARISON:  Chest x-ray 07/02/2018 FINDINGS: Examination demonstrates anterior shoulder dislocation. No acute fracture. Widening of the left AC joint. IMPRESSION: Anterior shoulder dislocation.  No definite fracture.  Widened AC joint which may represent grade 2 separation. Electronically Signed: By: Elberta Fortisaniel  Boyle M.D. On: 07/09/2018 14:20   Dg Shoulder Left Portable  Result Date: 07/09/2018 CLINICAL DATA:  Post reduction. EXAM: LEFT SHOULDER - 1 VIEW COMPARISON:  Earlier same day. FINDINGS: There is normal alignment over the glenohumeral joint post reduction of patient's previously seen anterior shoulder dislocation. Possible chip fracture along the undersurface of the distal clavicle. Interval decrease in the [facility] space, although the distal clavicles appears slightly higher than the adjacent chromium. IMPRESSION: Normal alignment post reduction of previously seen anterior shoulder dislocation. Chip fracture along the undersurface of the distal clavicle. Previous seen widened AC joint has been reduced although distal clavicle remains slightly higher than the acromion. Electronically Signed   By: Elberta Fortisaniel  Boyle M.D.   On: 07/09/2018 15:35     PMFS History: Patient Active Problem List   Diagnosis Date Noted  . Acromioclavicular separation, left, initial encounter 07/10/2018  . Traumatic closed displaced fracture of shoulder with anterior dislocation with routine healing 07/10/2018  . Sleep apnea in adult   . Precordial chest pain 07/02/2018  . Diabetes mellitus without complication (HCC)   . High cholesterol   . Hypertension    Past Medical History:  Diagnosis Date  . Diabetes mellitus without complication (HCC)   . High cholesterol   . Hypertension     Family History  Problem Relation Age of Onset  . Diabetes Mellitus II Mother   . Hypertension Mother   . Heart attack Mother     Past Surgical History:  Procedure Laterality Date  . CHOLECYSTECTOMY    . LEFT HEART CATH AND CORONARY ANGIOGRAPHY N/A 07/03/2018   Procedure: LEFT HEART CATH AND CORONARY ANGIOGRAPHY;  Surgeon: Lyn RecordsSmith, Henry W, MD;  [facility] IN[facility] LAB;  Service: Cardiovascular;  Laterality: N/A;  . ULTRASOUND GUIDANCE FOR  VASCULAR ACCESS  07/03/2018   Procedure: Ultrasound Guidance For Vascular Access;  Surgeon: Lyn RecordsSmith, Henry W, MD;  [facility];  Service: Cardiovascular;;   Social History   Occupational History  . Not on file  Tobacco Use  . Smoking status: Former Games developermoker  . Smokeless tobacco: Never Used  Substance and Sexual Activity  . Alcohol use: Never    Frequency: Never  . Drug use: Never  . Sexual activity: Not on file

## 2018-07-18 ENCOUNTER — Ambulatory Visit (INDEPENDENT_AMBULATORY_CARE_PROVIDER_SITE_OTHER): Payer: BLUE CROSS/BLUE SHIELD | Admitting: Orthopaedic Surgery

## 2018-07-18 VITALS — BP 176/99 | HR 75 | Ht 72.0 in | Wt 260.0 lb

## 2018-07-18 DIAGNOSIS — S52125A Nondisplaced fracture of head of left radius, initial encounter for closed fracture: Secondary | ICD-10-CM

## 2018-07-18 DIAGNOSIS — S43102A Unspecified dislocation of left acromioclavicular joint, initial encounter: Secondary | ICD-10-CM

## 2018-07-18 NOTE — Progress Notes (Signed)
Office Visit Note   Patient: Bruce Turner           Date of Birth: Jan 23, 1956           MRN: [id number] Visit Date: 07/18/2018              Requested by: Center, [facility] [address] [place], [place] [address] PCP: Center, [facility]   Assessment & Plan: Visit Diagnoses:  1. Acromioclavicular separation, left, initial encounter   2. Closed nondisplaced fracture of head of left radius, initial encounter     Plan: Patient work on elbow range of motion we will recheck him in 4 weeks.  His blood pressure is elevated he will go home and take his blood pressure medication which he forgot this morning.  We discussed the potential for recurrent instability of the shoulder and possibility of diagnostic imaging and possible surgical intervention for her shoulder this occurs.  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  Follow-Up Instructions: Return in about 4 weeks (around 08/15/2018).   Orders:  No orders of the defined types were placed in this encounter.  No orders of the defined types were placed in this encounter.     Procedures: No procedures performed   Clinical Data: No additional findings.   Subjective: No chief complaint on file.   HPI follow-up multiple injuries with acromioclavicular separation on the left radial head fracture minimally displaced on the left.  Shoulder dislocation with reduction on the left.  Review of Systems no change   Objective: Vital Signs: BP (!) 176/99   Pulse 75   Ht 6' (1.829 m)   Wt 260 lb (117.9 kg)   BMI 35.26 kg/m   Physical Exam Constitutional:      Appearance: He is well-developed.  HENT:     Head: Normocephalic and atraumatic.  Eyes:     Pupils: Pupils are equal, round, and reactive to light.  Neck:     Thyroid: No thyromegaly.     Trachea: No tracheal deviation.  Cardiovascular:     Rate and Rhythm: Normal rate.  Pulmonary:     Effort: Pulmonary  effort is normal.     Breath sounds: No wheezing.  Abdominal:     General: Bowel sounds are normal.     Palpations: Abdomen is soft.  Skin:    General: Skin is warm and dry.     Capillary Refill: Capillary refill takes less than 2 seconds.  Neurological:     Mental Status: He is alert and oriented to person, place, and time.  Psychiatric:        Behavior: Behavior normal.        Thought Content: Thought content normal.        Judgment: Judgment normal.     Ortho Exam splint is removed no elbow effusion.  He can pronate supinate with mild discomfort mild tenderness over the radial head.  Shoulder is reduced.  Some high riding the distal clavicle from his grade 2 separation with tiny chip fracture at the acromioclavicular joint.  He can work on elbow flexion extension exercises use a sling intermittently.  Avoid external rotation and walk his fingers up the wall.  Specialty Comments:  No specialty comments available.  Imaging: No results found.   PMFS History: Patient Active Problem List   Diagnosis Date Noted  . Acromioclavicular separation, left, initial encounter 07/10/2018  . Closed nondisplaced fracture of head of left  radius 07/10/2018  . Anterior shoulder dislocation, initial encounter 07/10/2018  . Sleep apnea in adult   . Precordial chest pain 07/02/2018  . Diabetes mellitus without complication (HCC)   . High cholesterol   . Hypertension    Past Medical History:  Diagnosis Date  . Diabetes mellitus without complication (HCC)   . High cholesterol   . Hypertension     Family History  Problem Relation Age of Onset  . Diabetes Mellitus II Mother   . Hypertension Mother   . Heart attack Mother     Past Surgical History:  Procedure Laterality Date  . CHOLECYSTECTOMY    . LEFT HEART CATH AND CORONARY ANGIOGRAPHY N/A 07/03/2018   Procedure: LEFT HEART CATH AND CORONARY ANGIOGRAPHY;  Surgeon: Lyn Records, MD;  Location: [facility] INVAS[facility] CV LAB;  Service:  Cardiovascular;  Laterality: N/A;  . ULTRASOUND GUIDANCE FOR VASCULAR ACCESS  07/03/2018   Procedure: Ultrasound Guidance For Vascular Access;  Surgeon: Lyn Records, MD;  [facility]: [facility] INVASIVE CV LAB;  Service: Cardiovascular;;   Social History   Occupational History  . Not on file  Tobacco Use  . Smoking status: Former Games developer  . Smokeless tobacco: Never Used  Substance and Sexual Activity  . Alcohol use: Never    Frequency: Never  . Drug use: Never  . Sexual activity: Not on file

## 2018-08-11 ENCOUNTER — Encounter (INDEPENDENT_AMBULATORY_CARE_PROVIDER_SITE_OTHER): Payer: Self-pay | Admitting: Orthopaedic Surgery

## 2018-08-11 ENCOUNTER — Ambulatory Visit (INDEPENDENT_AMBULATORY_CARE_PROVIDER_SITE_OTHER): Payer: BLUE CROSS/BLUE SHIELD | Admitting: Orthopaedic Surgery

## 2018-08-11 VITALS — BP 131/86 | HR 51 | Ht 72.0 in | Wt 268.0 lb

## 2018-08-11 DIAGNOSIS — S43102A Unspecified dislocation of left acromioclavicular joint, initial encounter: Secondary | ICD-10-CM

## 2018-08-11 DIAGNOSIS — S43016A Anterior dislocation of unspecified humerus, initial encounter: Secondary | ICD-10-CM

## 2018-08-11 DIAGNOSIS — S52125A Nondisplaced fracture of head of left radius, initial encounter for closed fracture: Secondary | ICD-10-CM

## 2018-08-11 NOTE — Progress Notes (Signed)
Office Visit Note   Patient: Bruce Turner           Date of Birth: 1956/04/21           MRN: [id number] Visit Date: 08/11/2018              [facility] [address] [place], [place] [id number] [facility]: [facility]   Assessment & Plan: Visit Diagnoses:  1. Acromioclavicular separation, left, initial encounter   2. Closed nondisplaced fracture of head of left radius, initial encounter   3. Anterior shoulder dislocation, initial encounter     Plan: We will gradually work as well as a sling work on pulley system and he denies elbow pain.  We discussed avoiding abduction external rotation.  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  Work slip given no work x3 weeks recheck 3 weeks.[employer]  Follow-Up Instructions: Return in about 3 weeks (around 09/01/2018).   Orders:  No orders of the defined types were placed in this encounter.  No orders of the defined types were placed in this encounter.     Procedures: No procedures performed   Clinical Data: No additional findings.   Subjective: Chief Complaint  Patient presents with  . Left Shoulder - Routine Post Op    HPI follow-up fall off ladder 10 feet suffering shoulder dislocation on the left and clavicle chip evulsion injury with AC separation.  Closed nondisplaced fracture of the head of the left radius.  Review of Systems updated of note his Pap hypertension type 2 diabetes increased BMI, sleep apnea and the above injuries otherwise 14 point systems negative   Objective: Vital Signs: BP 131/86 (BP Location: Right Arm)   Pulse (!) 51   Ht 6' (1.829 m)   Wt 268 lb (121.6 kg)   BMI 36.35 kg/m   Physical Exam  Ortho Exam  Specialty Comments:  No specialty comments available.  Imaging: No results found.   PMFS History: Patient Active Problem List   Diagnosis Date Noted  . Acromioclavicular separation, left, initial encounter 07/10/2018  . Closed  nondisplaced fracture of head of left radius 07/10/2018  . Anterior shoulder dislocation, initial encounter 07/10/2018  . Sleep apnea in adult   . Precordial chest pain 07/02/2018  . Diabetes mellitus without complication (HCC)   . High cholesterol   . Hypertension    Past Medical History:  Diagnosis Date  . Diabetes mellitus without complication (HCC)   . High cholesterol   . Hypertension     Family History  Problem Relation Age of Onset  . Diabetes Mellitus II Mother   . Hypertension Mother   . Heart attack Mother     Past Surgical History:  Procedure Laterality Date  . CHOLECYSTECTOMY    . LEFT HEART CATH AND CORONARY ANGIOGRAPHY N/A 07/03/2018   Procedure: LEFT HEART CATH AND CORONARY ANGIOGRAPHY;  Surgeon: Lyn RecordsSmith, Henry W, MD;  [facility];  Service: Cardiovascular;  Laterality: N/A;  . ULTRASOUND GUIDANCE FOR VASCULAR ACCESS  07/03/2018   Procedure: Ultrasound Guidance For Vascular Access;  Surgeon: Lyn RecordsSmith, Henry W, MD;  [facility];  Service: Cardiovascular;;   Social History   Occupational History  . Not on file  Tobacco Use  . Smoking status: Former Games developermoker  . Smokeless tobacco: Never Used  Substance and Sexual Activity  . Alcohol use: Never    Frequency: Never  . Drug use: Never  . Sexual activity: Not on  file

## 2018-09-05 ENCOUNTER — Encounter (INDEPENDENT_AMBULATORY_CARE_PROVIDER_SITE_OTHER): Payer: Self-pay | Admitting: Orthopaedic Surgery

## 2018-09-05 ENCOUNTER — Ambulatory Visit (INDEPENDENT_AMBULATORY_CARE_PROVIDER_SITE_OTHER): Payer: BLUE CROSS/BLUE SHIELD | Admitting: Orthopaedic Surgery

## 2018-09-05 VITALS — Ht 72.0 in | Wt 268.0 lb

## 2018-09-05 DIAGNOSIS — S43016A Anterior dislocation of unspecified humerus, initial encounter: Secondary | ICD-10-CM

## 2018-09-05 DIAGNOSIS — S43015S Anterior dislocation of left humerus, sequela: Secondary | ICD-10-CM

## 2018-09-05 NOTE — Progress Notes (Signed)
   Post-Op Visit Note   Patient: Bruce Turner           Date of Birth: [birth year]           MRN: [id number] Visit Date: 09/05/2018 PCP: Center, [facility]   Assessment & Plan:  Chief Complaint:  Chief Complaint  Patient presents with  . Left Shoulder - Follow-up    DOI 07/09/2018  . Left Elbow - Follow-up    DOI 07/09/2018   Visit Diagnoses:  1. Anterior shoulder dislocation, initial encounter   2. Anterior shoulder dislocation, left, sequela     Plan: Patient remember the gym we discussed activities he could do to strengthen his arm with light weights but avoid abduction external rotation.  I discussed with him he puts his arm in a position like he is going to throw but he is at increased risk and the shoulder may leave her out and re-dislocate which will be acutely painful.  He is not quite ready to go back to posterior work-up plan to recheck him in 4 weeks work slip given today.  Once he reaches the level where he feels like he is ready to resume work he can let us know otherwise recheck 4 weeks. Follow-Up Instructions: No follow-ups on file.   Orders:  No orders of the defined types were placed in this encounter.  No orders of the defined types were placed in this encounter.   Imaging: No results found.  PMFS History: Patient Active Problem List   Diagnosis Date Noted  . Acromioclavicular separation, left, initial encounter 07/10/2018  . Anterior shoulder dislocation, left, sequela 07/10/2018  . Sleep apnea in adult   . Precordial chest pain 07/02/2018  . Diabetes mellitus without complication (HCC)   . High cholesterol   . Hypertension    Past Medical History:  Diagnosis Date  . Diabetes mellitus without complication (HCC)   . High cholesterol   . Hypertension     Family History  Problem Relation Age of Onset  . Diabetes Mellitus II Mother   . Hypertension Mother   . Heart attack Mother     Past Surgical History:  Procedure Laterality Date  .  CHOLECYSTECTOMY    . LEFT HEART CATH AND CORONARY ANGIOGRAPHY N/A 07/03/2018   Procedure: LEFT HEART CATH AND CORONARY ANGIOGRAPHY;  Surgeon: Lyn Records, MD;  [facility]: [facility] CV LAB;  Service: Cardiovascular;  Laterality: N/A;  . ULTRASOUND GUIDANCE FOR VASCULAR ACCESS  07/03/2018   Procedure: Ultrasound Guidance For Vascular Access;  Surgeon: Lyn Records, MD;  Location: [facility] INVASIVE CV LAB;  Service: Cardiovascular;;   Social History   Occupational History  . Not on file  Tobacco Use  . Smoking status: Former Games developer  . Smokeless tobacco: Never Used  Substance and Sexual Activity  . Alcohol use: Never    Frequency: Never  . Drug use: Never  . Sexual activity: Not on file

## 2018-09-12 ENCOUNTER — Telehealth (INDEPENDENT_AMBULATORY_CARE_PROVIDER_SITE_OTHER): Payer: Self-pay | Admitting: Orthopaedic Surgery

## 2018-09-12 NOTE — Telephone Encounter (Signed)
IC advised could pick up at front.  

## 2018-09-12 NOTE — Telephone Encounter (Signed)
Ok for note 

## 2018-09-12 NOTE — Telephone Encounter (Signed)
Ok to fix note thanks.  

## 2018-09-12 NOTE — Telephone Encounter (Signed)
New Message  Pt verbalized he feels he is capable of going back to work and needs a release to work note stating he can lift more than 25IBS.

## 2018-10-02 ENCOUNTER — Telehealth (INDEPENDENT_AMBULATORY_CARE_PROVIDER_SITE_OTHER): Payer: Self-pay | Admitting: *Deleted

## 2018-10-02 NOTE — Telephone Encounter (Signed)
Prescreened pt for COVID 19 for appt scheduled 10/03/2018 and pt answered NO to all questions 

## 2018-10-03 ENCOUNTER — Encounter (INDEPENDENT_AMBULATORY_CARE_PROVIDER_SITE_OTHER): Payer: Self-pay | Admitting: Orthopaedic Surgery

## 2018-10-03 ENCOUNTER — Ambulatory Visit (INDEPENDENT_AMBULATORY_CARE_PROVIDER_SITE_OTHER): Payer: BLUE CROSS/BLUE SHIELD | Admitting: Orthopaedic Surgery

## 2018-10-03 VITALS — Ht 72.0 in | Wt 260.0 lb

## 2018-10-03 DIAGNOSIS — S43015S Anterior dislocation of left humerus, sequela: Secondary | ICD-10-CM

## 2018-10-03 DIAGNOSIS — S43102A Unspecified dislocation of left acromioclavicular joint, initial encounter: Secondary | ICD-10-CM

## 2018-10-03 NOTE — Progress Notes (Signed)
   Post-Op Visit Note   Patient: Bruce Turner           Date of Birth: 02/08/1956           MRN: [id number] Visit Date: 10/03/2018 PCP: [facility]   Assessment & Plan: Global follow-up [employer] employees back at work region fell at home 07/09/2018 with multiple injuries left shoulder separation distal clavicle chip evulsion.  Left shoulder anterior dislocation reduced in the ER.  Left radial head nondisplaced fracture.  He is back at work he can abduct his shoulder just past 90.  He can reach the top of his head.  He still has olecranon bursal swelling of the left elbow but is not increased.  He is not on any narcotic pain medication currently is back to activities of daily living he still using his pulley system to work on shoulder range of motion.  Chief Complaint:  Chief Complaint  Patient presents with  . Left Shoulder - Follow-up    DOI 07/09/2018  . Left Elbow - Follow-up    DOI 07/09/2018   Visit Diagnoses:  1. Acromioclavicular separation, left, initial encounter   2. Anterior shoulder dislocation, left, sequela     Plan: He still has some persistent olecranon bursitis still does not full range of motion of the shoulder but it is painless used during activities of daily living other than reaching up to the top shelf.  Continue work on range of motion.  If he has persistent problems with the olecranon bursitis or it increases he like to have it drained he will let me know.  Currently has no erythema mild swelling none painful none nontender and no palpable nodules in the bursa.  Follow-Up Instructions: No follow-ups on file.   Orders:  No orders of the defined types were placed in this encounter.  No orders of the defined types were placed in this encounter.   Imaging: No results found.  PMFS History: Patient Active Problem List   Diagnosis Date Noted  . Anterior shoulder dislocation, left, sequela 07/10/2018  . Sleep apnea in adult   . Precordial chest pain  07/02/2018  . Diabetes mellitus without complication (HCC)   . High cholesterol   . Hypertension    Past Medical History:  Diagnosis Date  . Diabetes mellitus without complication (HCC)   . High cholesterol   . Hypertension     Family History  Problem Relation Age of Onset  . Diabetes Mellitus II Mother   . Hypertension Mother   . Heart attack Mother     Past Surgical History:  Procedure Laterality Date  . CHOLECYSTECTOMY    . LEFT HEART CATH AND CORONARY ANGIOGRAPHY N/A 07/03/2018   Procedure: LEFT HEART CATH AND CORONARY ANGIOGRAPHY;  Surgeon: Lyn Records, MD;  Location: [facility]AS[facility];  Service: Cardiovascular;  Laterality: N/A;  . ULTRASOUND GUIDANCE FOR VASCULAR ACCESS  07/03/2018   Procedure: Ultrasound Guidance For Vascular Access;  Surgeon: Lyn Records, MD;  Location: [facility] INVASIVE CV LAB;  Service: Cardiovascular;;   Social History   Occupational History  . Not on file  Tobacco Use  . Smoking status: Former Games developer  . Smokeless tobacco: Never Used  Substance and Sexual Activity  . Alcohol use: Never    Frequency: Never  . Drug use: Never  . Sexual activity: Not on file

## 2019-03-15 ENCOUNTER — Ambulatory Visit (INDEPENDENT_AMBULATORY_CARE_PROVIDER_SITE_OTHER): Payer: BC Managed Care – PPO | Admitting: Orthopedic Surgery

## 2019-03-15 ENCOUNTER — Encounter: Payer: Self-pay | Admitting: Orthopedic Surgery

## 2019-03-15 DIAGNOSIS — M25461 Effusion, right knee: Secondary | ICD-10-CM

## 2019-03-16 LAB — SYNOVIAL CELL COUNT + DIFF, W/ CRYSTALS
Basophils, %: 0 %
Eosinophils-Synovial: 0 % (ref 0–2)
Lymphocytes-Synovial Fld: 24 % (ref 0–74)
Monocyte/Macrophage: 14 % (ref 0–69)
Neutrophil, Synovial: 62 % — ABNORMAL HIGH (ref 0–24)
Synoviocytes, %: 0 % (ref 0–15)
WBC, Synovial: 109 cells/uL (ref <150)

## 2019-03-16 NOTE — Progress Notes (Signed)
Pls call no gout

## 2019-03-17 ENCOUNTER — Encounter: Payer: Self-pay | Admitting: Orthopedic Surgery

## 2019-03-17 DIAGNOSIS — M25461 Effusion, right knee: Secondary | ICD-10-CM

## 2019-03-17 MED ORDER — LIDOCAINE HCL 1 % IJ SOLN
5.0000 mL | INTRAMUSCULAR | Status: AC | PRN
  Administered 2019-03-17: 5 mL

## 2019-03-17 NOTE — Progress Notes (Signed)
Office Visit Note   Patient: Bruce Turner           Date of Birth: [birth year]           MRN: [id number] Visit Date: 03/15/2019 Requested by: [facility] [address] [place],  [place] [id number] PCP: [facility]  Subjective: Chief Complaint  Patient presents with  . Right Knee - Pain    HPI: Bruce Turner is a patient with right knee pain.  He is unsure if he had an injury.  Does report acute onset of pain and swelling about a month ago.  Reports some clicking with certain movements.  CT scan was performed which showed degenerative changes with joint effusion.  He states that he cannot have an MRI scan.              ROS: All systems reviewed are negative as they relate to the chief complaint within the history of present illness.  Patient denies  fevers or chills.   Assessment & Plan: Visit Diagnoses:  1. Effusion, right knee     Plan: Impression is right knee pain which is quick onset consistent more with gout than anything else.  No definite history of injury.  Aspiration performed today and that fluid was negative for crystals at the time of this dictation.  Therefore I think he does have pain and swelling in the knee with CT scan which shows degenerative changes.  Plan to see him back in 2 weeks to decide for or against diagnostic arthroscopy.  I do not think we can get a great sense of exactly what is going on in that knee without an invasive procedure.  I am not sure if his symptoms warrant that at this time.  We will see him back in 2 weeks to make that decision.  Follow-Up Instructions: Return in about 2 weeks (around 03/29/2019).   Orders:  Orders Placed This Encounter  Procedures  . Cell count + diff,  w/ cryst-synvl fld   No orders of the defined types were placed in this encounter.     Procedures: Large Joint Inj: L knee on 03/17/2019 11:58 AM Indications: diagnostic evaluation, joint swelling and pain Details: 18 G 1.5 in needle, superolateral  approach  Arthrogram: No  Medications: 5 mL lidocaine 1 % Aspirate: 20 mL yellow; sent for lab analysis Outcome: tolerated well, no immediate complications Procedure, treatment alternatives, risks and benefits explained, specific risks discussed. Consent was given by the patient. Immediately prior to procedure a time out was called to verify the correct patient, procedure, equipment, support staff and site/side marked as required. Patient was prepped and draped in the usual sterile fashion.       Clinical Data: No additional findings.  Objective: Vital Signs: There were no vitals taken for this visit.  Physical Exam:   Constitutional: Patient appears well-developed HEENT:  Head: Normocephalic Eyes:EOM are normal Neck: Normal range of motion Cardiovascular: Normal rate Pulmonary/chest: Effort normal Neurologic: Patient is alert Skin: Skin is warm Psychiatric: Patient has normal mood and affect    Ortho Exam: Ortho exam demonstrates intact extensor mechanism with mild right knee effusion.  Stable collateral cruciate ligaments.  No groin pain on the right-hand side with internal X rotation of the leg.  No other masses lymphadenopathy or skin changes noted in that right knee region.  Extensor mechanism is intact.  Specialty Comments:  No specialty comments available.  Imaging: No results found.   PMFS History: Patient Active Problem List  Diagnosis Date Noted  . Anterior shoulder dislocation, left, sequela 07/10/2018  . Sleep apnea in adult   . Precordial chest pain 07/02/2018  . Diabetes mellitus without complication (HCC)   . High cholesterol   . Hypertension    Past Medical History:  Diagnosis Date  . Diabetes mellitus without complication (HCC)   . High cholesterol   . Hypertension     Family History  Problem Relation Age of Onset  . Diabetes Mellitus II Mother   . Hypertension Mother   . Heart attack Mother     Past Surgical History:  Procedure  Laterality Date  . CHOLECYSTECTOMY    . LEFT HEART CATH AND CORONARY ANGIOGRAPHY N/A 07/03/2018   Procedure: LEFT HEART CATH AND CORONARY ANGIOGRAPHY;  Surgeon: Lyn RecordsSmith, Henry W, MD;  Location: [facility];  Service: Cardiovascular;  Laterality: N/A;  . ULTRASOUND GUIDANCE FOR VASCULAR ACCESS  07/03/2018   Procedure: Ultrasound Guidance For Vascular Access;  Surgeon: Lyn RecordsSmith, Henry W, MD;  [facility];  Service: Cardiovascular;;   Social History   Occupational History  . Not on file  Tobacco Use  . Smoking status: Former Games developermoker  . Smokeless tobacco: Never Used  Substance and Sexual Activity  . Alcohol use: Never    Frequency: Never  . Drug use: Never  . Sexual activity: Not on file

## 2020-07-01 IMAGING — DX DG ELBOW 2V*L*
2 series · 2 of 2 positions shown · non-contrast
Comparison: None.

CLINICAL DATA: Fall 10 feet from ladder landing on back with left
shoulder and elbow pain.

EXAM:
LEFT ELBOW - 2 VIEW

[elbow ap]
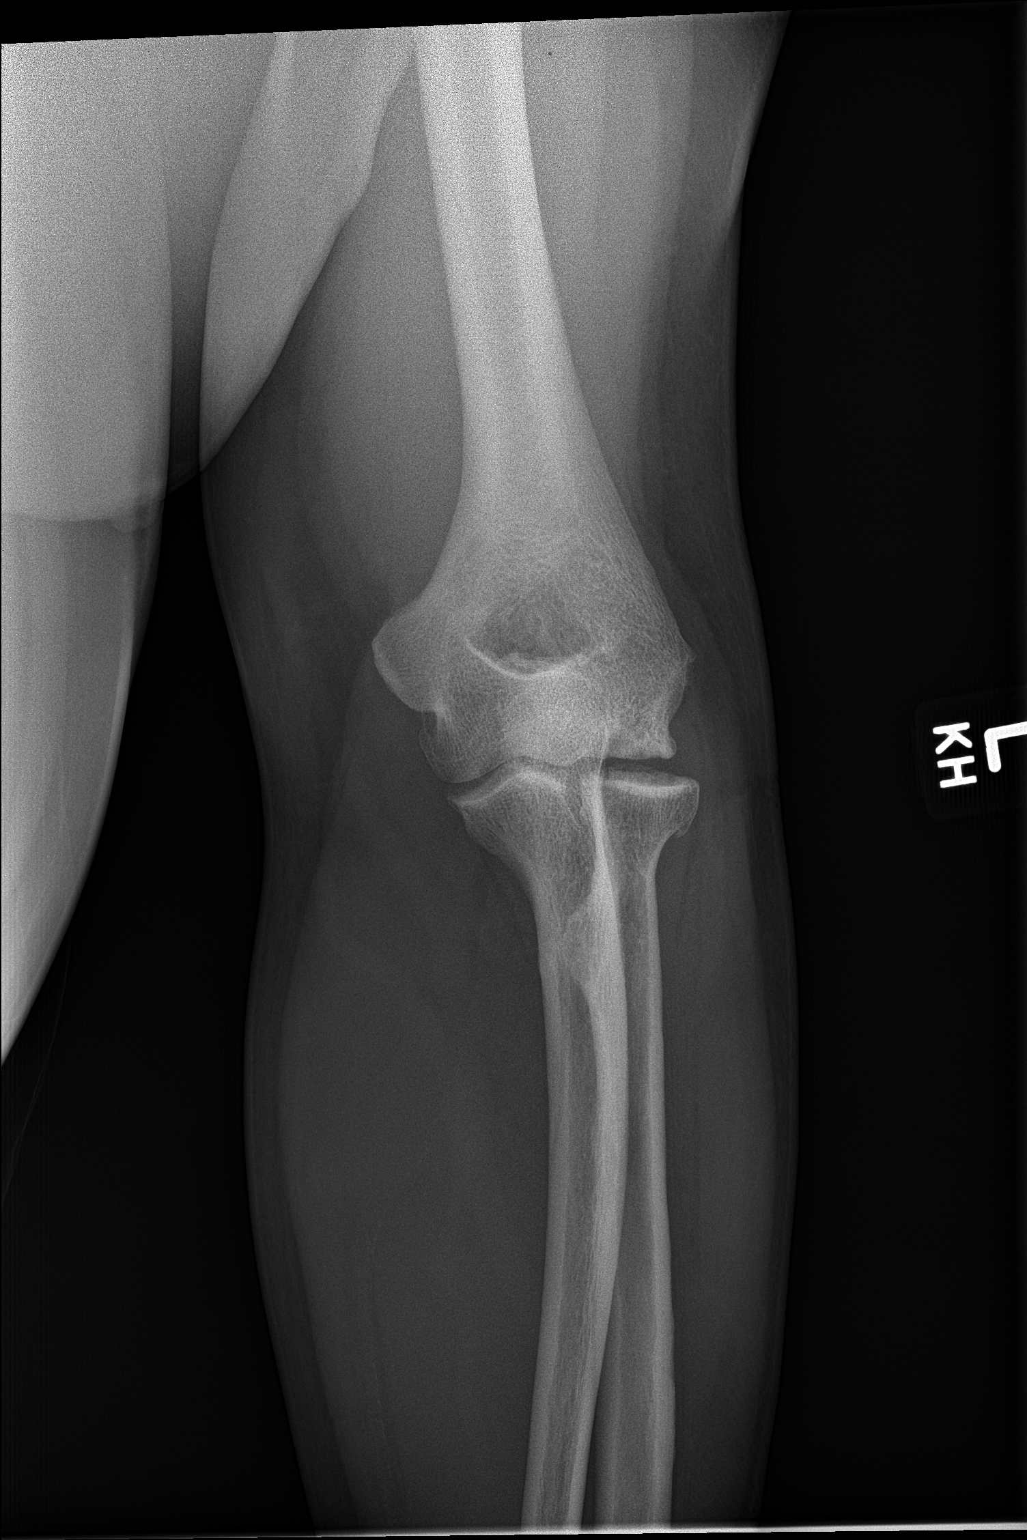

[elbow lat]
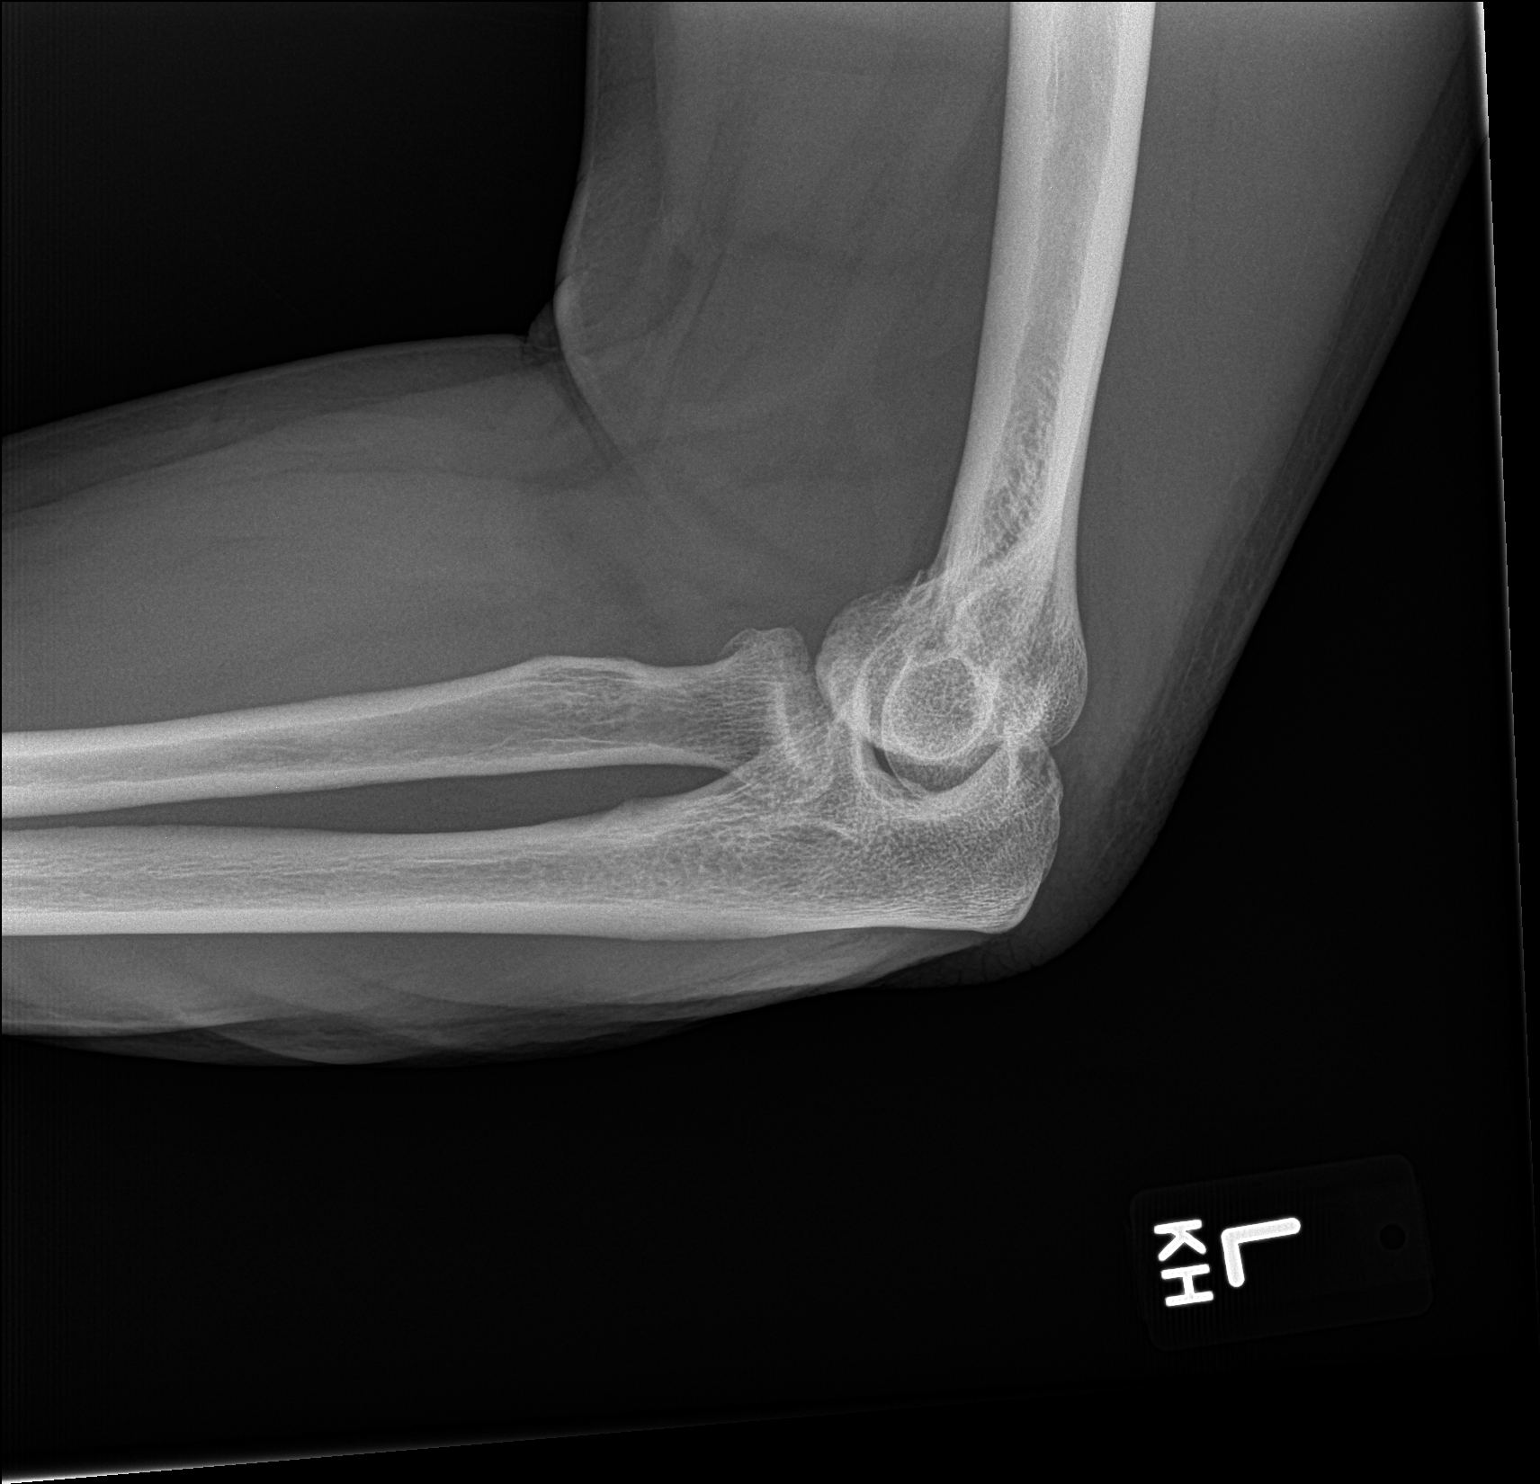

[2 of 2 positions shown; findings below may reference images not displayed]

FINDINGS: Examination demonstrates a minimally displaced radial head fracture.
Subtle cortical irregularity along the anterior aspect of the distal
humerus at the elbow joint which may represent a nondisplaced
condylar fracture. Remaining bones and soft tissues are
unremarkable.
IMPRESSION: Minimally displaced radial head fracture.

Subtle cortical irregularity along the anterior aspect of the distal
humeral condyle as this may represent a nondisplaced fracture. Four
view elbow series may be helpful for further evaluation.
